# Patient Record
Sex: Female | Born: 1985 | Race: White | Hispanic: No | Marital: Married | Smoking: Never smoker
Health system: Southern US, Community
[De-identification: ages and names within clinical notes are randomized; demographics above are authoritative.]

## PROBLEM LIST (undated history)

## (undated) DIAGNOSIS — O24419 Gestational diabetes mellitus in pregnancy, unspecified control: Secondary | ICD-10-CM

## (undated) HISTORY — DX: Gestational diabetes mellitus in pregnancy, unspecified control: O24.419

## (undated) HISTORY — PX: NO PAST SURGERIES: SHX2092

---

## 2016-10-10 DIAGNOSIS — O24419 Gestational diabetes mellitus in pregnancy, unspecified control: Secondary | ICD-10-CM

## 2016-10-10 HISTORY — DX: Gestational diabetes mellitus in pregnancy, unspecified control: O24.419

## 2018-10-10 NOTE — L&D Delivery Note (Signed)
       Delivery Note   Danielle Chen is a 33 y.o. G2P1001 at [redacted]w[redacted]d Estimated Date of Delivery: 04/28/19  PRE-OPERATIVE DIAGNOSIS:  1) [redacted]w[redacted]d pregnancy.  2) SROM with irreg contractiosn  POST-OPERATIVE DIAGNOSIS:  1) [redacted]w[redacted]d pregnancy s/p Vaginal, Spontaneous    Delivery Type: Vaginal, Spontaneous    Delivery Anesthesia: None   Labor Complications:  None    ESTIMATED BLOOD LOSS: 150  ml    FINDINGS:   1) female infant, Apgar scores of    at 1 minute and    at 5 minutes and a birthweight of   ounces.    2) Nuchal cord: No  SPECIMENS:   PLACENTA:   Appearance: Intact    Removal: Spontaneous      Disposition:    DISPOSITION:  Infant to left in stable condition in the delivery room, with L&D personnel and mother,  NARRATIVE SUMMARY: Labor course:  [name] is a G2P1001 at [redacted]w[redacted]d who presented for contractions with fluid leakage.  ROM + was positive.  Pt had irreg contractions through the night, but wanted to rest.  Pitocin was begun this morning and titrated up. She began contracting regularly.  ROM of forebag performed. She evidenced good maternal expulsive effort during the second stage. She went on to deliver a viable infant. The placenta delivered without problems and was noted to be complete. A perineal and vaginal examination was performed. Episiotomy/Lacerations: 1st degree  Episiotomy or lacerations were repaired with Vicryl suture using local anesthesia. The patient tolerated this well.  Finis Bud, M.D. 04/24/2019 10:37 AM

## 2018-10-11 NOTE — Patient Instructions (Signed)
WHAT OB PATIENTS CAN EXPECT   Confirmation of pregnancy and ultrasound ordered if medically indicated-[redacted] weeks gestation  New OB (NOB) intake with nurse and New OB (NOB) labs- [redacted] weeks gestation  New OB (NOB) physical examination with provider- 11/[redacted] weeks gestation  Flu vaccine-[redacted] weeks gestation  Anatomy scan-[redacted] weeks gestation  Glucose tolerance test, blood work to test for anemia, T-dap vaccine-[redacted] weeks gestation  Vaginal swabs/cultures-STD/Group B strep-[redacted] weeks gestation  Appointments every 4 weeks until 28 weeks  Every 2 weeks from 28 weeks until 36 weeks  Weekly visits from 36 weeks until delivery  Morning Sickness  Morning sickness is when you feel sick to your stomach (nauseous) during pregnancy. You may feel sick to your stomach and throw up (vomit). You may feel sick in the morning, but you can feel this way at any time of day. Some women feel very sick to their stomach and cannot stop throwing up (hyperemesis gravidarum). Follow these instructions at home: Medicines  Take over-the-counter and prescription medicines only as told by your doctor. Do not take any medicines until you talk with your doctor about them first.  Taking multivitamins before getting pregnant can stop or lessen the harshness of morning sickness. Eating and drinking  Eat dry toast or crackers before getting out of bed.  Eat 5 or 6 small meals a day.  Eat dry and bland foods like rice and baked potatoes.  Do not eat greasy, fatty, or spicy foods.  Have someone cook for you if the smell of food causes you to feel sick or throw up.  If you feel sick to your stomach after taking prenatal vitamins, take them at night or with a snack.  Eat protein when you need a snack. Nuts, yogurt, and cheese are good choices.  Drink fluids throughout the day.  Try ginger ale made with real ginger, ginger tea made from fresh grated ginger, or ginger candies. General instructions  Do not use any products  that have nicotine or tobacco in them, such as cigarettes and e-cigarettes. If you need help quitting, ask your doctor.  Use an air purifier to keep the air in your house free of smells.  Get lots of fresh air.  Try to avoid smells that make you feel sick.  Try: ? Wearing a bracelet that is used for seasickness (acupressure wristband). ? Going to a doctor who puts thin needles into certain body points (acupuncture) to improve how you feel. Contact a doctor if:  You need medicine to feel better.  You feel dizzy or light-headed.  You are losing weight. Get help right away if:  You feel very sick to your stomach and cannot stop throwing up.  You pass out (faint).  You have very bad pain in your belly. Summary  Morning sickness is when you feel sick to your stomach (nauseous) during pregnancy.  You may feel sick in the morning, but you can feel this way at any time of day.  Making some changes to what you eat may help your symptoms go away. This information is not intended to replace advice given to you by your health care provider. Make sure you discuss any questions you have with your health care provider. Document Released: 11/03/2004 Document Revised: 10/27/2016 Document Reviewed: 10/27/2016 Elsevier Interactive Patient Education  2019 [employer]. How a Baby Grows During Pregnancy  Pregnancy begins when a female's sperm enters a female's egg (fertilization). Fertilization usually happens in one of the tubes (fallopian tubes) that connect  the ovaries to the womb (uterus). The fertilized egg moves down the fallopian tube to the uterus. Once it reaches the uterus, it implants into the lining of the uterus and begins to grow. For the first 10 weeks, the fertilized egg is called an embryo. After 10 weeks, it is called a fetus. As the fetus continues to grow, it receives oxygen and nutrients through tissue (placenta) that grows to support the developing baby. The placenta is the life  support system for the baby. It provides oxygen and nutrition and removes waste. Learning as much as you can about your pregnancy and how your baby is developing can help you enjoy the experience. It can also make you aware of when there might be a problem and when to ask questions. How long does a typical pregnancy last? A pregnancy usually lasts 280 days, or about 40 weeks. Pregnancy is divided into three periods of growth, also called trimesters:  First trimester: 0-12 weeks.  Second trimester: 13-27 weeks.  Third trimester: 28-40 weeks. The day when your baby is ready to be born (full term) is your estimated date of delivery. How does my baby develop month by month? First month  The fertilized egg attaches to the inside of the uterus.  Some cells will form the placenta. Others will form the fetus.  The arms, legs, brain, spinal cord, lungs, and heart begin to develop.  At the end of the first month, the heart begins to beat. Second month  The bones, inner ear, eyelids, hands, and feet form.  The genitals develop.  By the end of 8 weeks, all major organs are developing. Third month  All of the internal organs are forming.  Teeth develop below the gums.  Bones and muscles begin to grow. The spine can flex.  The skin is transparent.  Fingernails and toenails begin to form.  Arms and legs continue to grow longer, and hands and feet develop.  The fetus is about 3 inches (7.6 cm) long. Fourth month  The placenta is completely formed.  The external sex organs, neck, outer ear, eyebrows, eyelids, and fingernails are formed.  The fetus can hear, swallow, and move its arms and legs.  The kidneys begin to produce urine.  The skin is covered with a white, waxy coating (vernix) and very fine hair (lanugo). Fifth month  The fetus moves around more and can be felt for the first time (quickening).  The fetus starts to sleep and wake up and may begin to suck its  finger.  The nails grow to the end of the fingers.  The organ in the digestive system that makes bile (gallbladder) functions and helps to digest nutrients.  If your baby is a girl, eggs are present in her ovaries. If your baby is a boy, testicles start to move down into his scrotum. Sixth month  The lungs are formed.  The eyes open. The brain continues to develop.  Your baby has fingerprints and toe prints. Your baby's hair grows thicker.  At the end of the second trimester, the fetus is about 9 inches (22.9 cm) long. Seventh month  The fetus kicks and stretches.  The eyes are developed enough to sense changes in light.  The hands can make a grasping motion.  The fetus responds to sound. Eighth month  All organs and body systems are fully developed and functioning.  Bones harden, and taste buds develop. The fetus may hiccup.  Certain areas of the brain are still developing.  The skull remains soft. Ninth month  The fetus gains about  lb (0.23 kg) each week.  The lungs are fully developed.  Patterns of sleep develop.  The fetus's head typically moves into a head-down position (vertex) in the uterus to prepare for birth.  The fetus weighs 6-9 lb (2.72-4.08 kg) and is 19-20 inches (48.26-50.8 cm) long. What can I do to have a healthy pregnancy and help my baby develop? General instructions  Take prenatal vitamins as directed by your health care provider. These include vitamins such as folic acid, iron, calcium, and vitamin D. They are important for healthy development.  Take medicines only as directed by your health care provider. Read labels and ask a pharmacist or your health care provider whether over-the-counter medicines, supplements, and prescription drugs are safe to take during pregnancy.  Keep all follow-up visits as directed by your health care provider. This is important. Follow-up visits include prenatal care and screening tests. How do I know if my baby  is developing well? At each prenatal visit, your health care provider will do several different tests to check on your health and keep track of your baby's development. These include:  Fundal height and position. ? Your health care provider will measure your growing belly from your pubic bone to the top of the uterus using a tape measure. ? Your health care provider will also feel your belly to determine your baby's position.  Heartbeat. ? An ultrasound in the first trimester can confirm pregnancy and show a heartbeat, depending on how far along you are. ? Your health care provider will check your baby's heart rate at every prenatal visit.  Second trimester ultrasound. ? This ultrasound checks your baby's development. It also may show your baby's gender. What should I do if I have concerns about my baby's development? Always talk with your health care provider about any concerns that you may have about your pregnancy and your baby. Summary  A pregnancy usually lasts 280 days, or about 40 weeks. Pregnancy is divided into three periods of growth, also called trimesters.  Your health care provider will monitor your baby's growth and development throughout your pregnancy.  Follow your health care provider's recommendations about taking prenatal vitamins and medicines during your pregnancy.  Talk with your health care provider if you have any concerns about your pregnancy or your developing baby. This information is not intended to replace advice given to you by your health care provider. Make sure you discuss any questions you have with your health care provider. Document Released: 03/14/2008 Document Revised: 08/09/2017 Document Reviewed: 08/09/2017 Elsevier Interactive Patient Education  2019 [employer] P[employer]  The first trimester of pregnancy is from week 1 until the end of week 13 (months 1 through 3). During this time, your baby will begin to develop inside  you. At 6-8 weeks, the eyes and face are formed, and the heartbeat can be seen on ultrasound. At the end of 12 weeks, all the baby's organs are formed. Prenatal care is all the medical care you receive before the birth of your baby. Make sure you get good prenatal care and follow all of your doctor's instructions. Follow these instructions at home: Medicines  Take over-the-counter and prescription medicines only as told by your doctor. Some medicines are safe and some medicines are not safe during pregnancy.  Take a prenatal vitamin that contains at least 600 micrograms (mcg) of folic acid.  If you have trouble pooping (constipation), take  medicine that will make your stool soft (stool softener) if your doctor approves. Eating and drinking   Eat regular, healthy meals.  Your doctor will tell you the amount of weight gain that is right for you.  Avoid raw meat and uncooked cheese.  If you feel sick to your stomach (nauseous) or throw up (vomit): ? Eat 4 or 5 small meals a day instead of 3 large meals. ? Try eating a few soda crackers. ? Drink liquids between meals instead of during meals.  To prevent constipation: ? Eat foods that are high in fiber, like fresh fruits and vegetables, whole grains, and beans. ? Drink enough fluids to keep your pee (urine) clear or pale yellow. Activity  Exercise only as told by your doctor. Stop exercising if you have cramps or pain in your lower belly (abdomen) or low back.  Do not exercise if it is too hot, too humid, or if you are in a place of great height (high altitude).  Try to avoid standing for long periods of time. Move your legs often if you must stand in one place for a long time.  Avoid heavy lifting.  Wear low-heeled shoes. Sit and stand up straight.  You can have sex unless your doctor tells you not to. Relieving pain and discomfort  Wear a good support bra if your breasts are sore.  Take warm water baths (sitz baths) to soothe  pain or discomfort caused by hemorrhoids. Use hemorrhoid cream if your doctor says it is okay.  Rest with your legs raised if you have leg cramps or low back pain.  If you have puffy, bulging veins (varicose veins) in your legs: ? Wear support hose or compression stockings as told by your doctor. ? Raise (elevate) your feet for 15 minutes, 3-4 times a day. ? Limit salt in your food. Prenatal care  Schedule your prenatal visits by the twelfth week of pregnancy.  Write down your questions. Take them to your prenatal visits.  Keep all your prenatal visits as told by your doctor. This is important. Safety  Wear your seat belt at all times when driving.  Make a list of emergency phone numbers. The list should include numbers for family, friends, the hospital, and police and fire departments. General instructions  Ask your doctor for a referral to a local prenatal class. Begin classes no later than at the start of month 6 of your pregnancy.  Ask for help if you need counseling or if you need help with nutrition. Your doctor can give you advice or tell you where to go for help.  Do not use hot tubs, steam rooms, or saunas.  Do not douche or use tampons or scented sanitary pads.  Do not cross your legs for long periods of time.  Avoid all herbs and alcohol. Avoid drugs that are not approved by your doctor.  Do not use any tobacco products, including cigarettes, chewing tobacco, and electronic cigarettes. If you need help quitting, ask your doctor. You may get counseling or other support to help you quit.  Avoid cat litter boxes and soil used by cats. These carry germs that can cause birth defects in the baby and can cause a loss of your baby (miscarriage) or stillbirth.  Visit your dentist. At home, brush your teeth with a soft toothbrush. Be gentle when you floss. Contact a doctor if:  You are dizzy.  You have mild cramps or pressure in your lower belly.  You have a  nagging pain  in your belly area.  You continue to feel sick to your stomach, you throw up, or you have watery poop (diarrhea).  You have a bad smelling fluid coming from your vagina.  You have pain when you pee (urinate).  You have increased puffiness (swelling) in your face, hands, legs, or ankles. Get help right away if:  You have a fever.  You are leaking fluid from your vagina.  You have spotting or bleeding from your vagina.  You have very bad belly cramping or pain.  You gain or lose weight rapidly.  You throw up blood. It may look like coffee grounds.  You are around people who have German measles, fifth disease, or chickenpox.  You have a very bad headache.  You have shortness of breath.  You have any kind of trauma, such as from a fall or a car accident. Summary  The first trimester of pregnancy is from week 1 until the end of week 13 (months 1 through 3).  To take care of yourself and your unborn baby, you will need to eat healthy meals, take medicines only if your doctor tells you to do so, and do activities that are safe for you and your baby.  Keep all follow-up visits as told by your doctor. This is important as your doctor will have to ensure that your baby is healthy and growing well. This information is not intended to replace advice given to you by your health care provider. Make sure you discuss any questions you have with your health care provider. Document Released: 03/14/2008 Document Revised: 10/04/2016 Document Reviewed: 10/04/2016 Elsevier Interactive Patient Education  2019 [employer] Commonly Asked Questions During Pregnancy  Cats: A parasite can be excreted in cat feces.  To avoid exposure you need to have another person empty the little box.  If you must empty the litter box you will need to wear gloves.  Wash your hands after handling your cat.  This parasite can also be found in raw or undercooked meat so this should also be avoided.  Colds, Sore  Throats, Flu: Please check your medication sheet to see what you can take for symptoms.  If your symptoms are unrelieved by these medications please call the office.  Dental Work: Most any dental work your dentist recommends is permitted.  X-rays should only be taken during the first trimester if absolutely necessary.  Your abdomen should be shielded with a lead apron during all x-rays.  Please notify your provider prior to receiving any x-rays.  Novocaine is fine; gas is not recommended.  If your dentist requires a note from us prior to dental work please call the office and we will provide one for you.  Exercise: Exercise is an important part of staying healthy during your pregnancy.  You may continue most exercises you were accustomed to prior to pregnancy.  Later in your pregnancy you will most likely notice you have difficulty with activities requiring balance like riding a bicycle.  It is important that you listen to your body and avoid activities that put you at a higher risk of falling.  Adequate rest and staying well hydrated are a must!  If you have questions about the safety of specific activities ask your provider.    Exposure to Children with illness: Try to avoid obvious exposure; report any symptoms to us when noted,  If you have chicken pos, red measles or mumps, you should be immune to these diseases.     Please do not take any vaccines while pregnant unless you have checked with your OB provider.  Fetal Movement: After 28 weeks we recommend you do "kick counts" twice daily.  Lie or sit down in a calm quiet environment and count your baby movements "kicks".  You should feel your baby at least 10 times per hour.  If you have not felt 10 kicks within the first hour get up, walk around and have something sweet to eat or drink then repeat for an additional hour.  If count remains less than 10 per hour notify your provider.  Fumigating: Follow your pest control agent's advice as to how long to  stay out of your home.  Ventilate the area well before re-entering.  Hemorrhoids:   Most over-the-counter preparations can be used during pregnancy.  Check your medication to see what is safe to use.  It is important to use a stool softener or fiber in your diet and to drink lots of liquids.  If hemorrhoids seem to be getting worse please call the office.   Hot Tubs:  Hot tubs Jacuzzis and saunas are not recommended while pregnant.  These increase your internal body temperature and should be avoided.  Intercourse:  Sexual intercourse is safe during pregnancy as long as you are comfortable, unless otherwise advised by your provider.  Spotting may occur after intercourse; report any bright red bleeding that is heavier than spotting.  Labor:  If you know that you are in labor, please go to the hospital.  If you are unsure, please call the office and let us help you decide what to do.  Lifting, straining, etc:  If your job requires heavy lifting or straining please check with your provider for any limitations.  Generally, you should not lift items heavier than that you can lift simply with your hands and arms (no back muscles)  Painting:  Paint fumes do not harm your pregnancy, but may make you ill and should be avoided if possible.  Latex or water based paints have less odor than oils.  Use adequate ventilation while painting.  Permanents & Hair Color:  Chemicals in hair dyes are not recommended as they cause increase hair dryness which can increase hair loss during pregnancy.  " Highlighting" and permanents are allowed.  Dye may be absorbed differently and permanents may not hold as well during pregnancy.  Sunbathing:  Use a sunscreen, as skin burns easily during pregnancy.  Drink plenty of fluids; avoid over heating.  Tanning Beds:  Because their possible side effects are still unknown, tanning beds are not recommended.  Ultrasound Scans:  Routine ultrasounds are performed at approximately 20  weeks.  You will be able to see your baby's general anatomy an if you would like to know the gender this can usually be determined as well.  If it is questionable when you conceived you may also receive an ultrasound early in your pregnancy for dating purposes.  Otherwise ultrasound exams are not routinely performed unless there is a medical necessity.  Although you can request a scan we ask that you pay for it when conducted because insurance does not cover " patient request" scans.  Work: If your pregnancy proceeds without complications you may work until your due date, unless your physician or employer advises otherwise.  Round Ligament Pain/Pelvic Discomfort:  Sharp, shooting pains not associated with bleeding are fairly common, usually occurring in the second trimester of pregnancy.  They tend to be worse when standing up or  when you remain standing for long periods of time.  These are the result of pressure of certain pelvic ligaments called "round ligaments".  Rest, Tylenol and heat seem to be the most effective relief.  As the womb and fetus grow, they rise out of the pelvis and the discomfort improves.  Please notify the office if your pain seems different than that described.  It may represent a more serious condition.  Common Medications Safe in Pregnancy  Acne:      Constipation:  Benzoyl Peroxide     Colace  Clindamycin      Dulcolax Suppository  Topica Erythromycin     Fibercon  Salicylic Acid      Metamucil         Miralax AVOID:        Senakot   Accutane    Cough:  Retin-A       Cough Drops  Tetracycline      Phenergan w/ Codeine if Rx  Minocycline      Robitussin (Plain & DM)  Antibiotics:     Crabs/Lice:  Ceclor       RID  Cephalosporins    AVOID:  E-Mycins      Kwell  Keflex  Macrobid/Macrodantin   Diarrhea:  Penicillin      Kao-Pectate  Zithromax      Imodium AD         PUSH FLUIDS AVOID:       Cipro     Fever:  Tetracycline      Tylenol (Regular or  Extra  Minocycline       Strength)  Levaquin      Extra Strength-Do not          Exceed 8 tabs/24 hrs Caffeine:        <224m/day (equiv. To 1 cup of coffee or  approx. 3 12 oz sodas)         Gas: Cold/Hayfever:       Gas-X  Benadryl      Mylicon  Claritin       Phazyme  **Claritin-D        Chlor-Trimeton    Headaches:  Dimetapp      ASA-Free Excedrin  Drixoral-Non-Drowsy     Cold Compress  Mucinex (Guaifenasin)     Tylenol (Regular or Extra  Sudafed/Sudafed-12 Hour     Strength)  **Sudafed PE Pseudoephedrine   Tylenol Cold & Sinus     Vicks Vapor Rub  Zyrtec  **AVOID if Problems With Blood Pressure         Heartburn: Avoid lying down for at least 1 hour after meals  Aciphex      Maalox     Rash:  Milk of Magnesia     Benadryl    Mylanta       1% Hydrocortisone Cream  Pepcid  Pepcid Complete   Sleep Aids:  Prevacid      Ambien   Prilosec       Benadryl  Rolaids       Chamomile Tea  Tums (Limit 4/day)     Unisom  Zantac       Tylenol PM         Warm milk-add vanilla or  Hemorrhoids:       Sugar for taste  Anusol/Anusol H.C.  (RX: Analapram 2.5%)  Sugar Substitutes:  Hydrocortisone OTC     Ok in moderation  Preparation H      Tucks  Vaseline lotion applied to tissue with wiping    Herpes:     Throat:  Acyclovir      Oragel  Famvir  Valtrex     Vaccines:         Flu Shot Leg Cramps:       *Gardasil  Benadryl      Hepatitis A         Hepatitis B Nasal Spray:       Pneumovax  Saline Nasal Spray     Polio Booster         Tetanus Nausea:       Tuberculosis test or PPD  Vitamin B6 25 mg TID   AVOID:    Dramamine      *Gardasil  Emetrol       Live Poliovirus  Ginger Root 250 mg QID    MMR (measles, mumps &  High Complex Carbs @ Bedtime    rebella)  Sea Bands-Accupressure    Varicella (Chickenpox)  Unisom 1/2 tab TID     *No known complications           If received before Pain:         Known pregnancy;   Darvocet       Resume series  after  Lortab        Delivery  Percocet    Yeast:   Tramadol      Femstat  Tylenol 3      Gyne-lotrimin  Ultram       Monistat  Vicodin           MISC:         All Sunscreens           Hair Coloring/highlights          Insect Repellant's          (Including DEET)         Mystic Tans

## 2018-10-11 NOTE — Progress Notes (Signed)
      Danielle NeedyMichelle Grabel presents for NOB nurse intake visit. [facility] [place] [place]/[facility], with Pierce CranePortia Cohens, MD.  Luna GlasgowG2.  P1001.  LMP 07/12/18.  EDD 04/28/19.  Ga [id number]. Pregnancy education material explained and given.  1 cats in the home.  NOB labs ordered. BMI less than 30. TSH/HbgA1c not ordered.  HIV and drug screen explained and ordered. Genetic screening discussed. Genetic testing; Unsure. Pt to discuss genetic testing with provider. PNV encouraged. Pt to follow up with provider in 1 weeks for NOB physical. [facility] Financial Policy and FMLA forms were signed and went over with pt. Pt stated that she understood.   BP 127/80   Pulse 88   Ht 5' 3.5" (1.613 m)   Wt 130 lb 11.2 oz (59.3 kg)   LMP 07/12/2018   BMI 22.79 kg/m

## 2018-10-12 ENCOUNTER — Ambulatory Visit: Payer: Self-pay | Admitting: Obstetrics and Gynecology

## 2018-10-12 VITALS — BP 127/80 | HR 88 | Ht 63.5 in | Wt 130.7 lb

## 2018-10-12 DIAGNOSIS — Z113 Encounter for screening for infections with a predominantly sexual mode of transmission: Secondary | ICD-10-CM

## 2018-10-12 DIAGNOSIS — Z0283 Encounter for blood-alcohol and blood-drug test: Secondary | ICD-10-CM

## 2018-10-12 DIAGNOSIS — Z3481 Encounter for supervision of other normal pregnancy, first trimester: Secondary | ICD-10-CM

## 2018-10-13 LAB — HEPATITIS B SURFACE ANTIGEN: Hepatitis B Surface Ag: NEGATIVE

## 2018-10-13 LAB — URINALYSIS, ROUTINE W REFLEX MICROSCOPIC
Bilirubin, UA: NEGATIVE
Glucose, UA: NEGATIVE
Ketones, UA: NEGATIVE
Leukocytes, UA: NEGATIVE
Nitrite, UA: NEGATIVE
Protein, UA: NEGATIVE
RBC UA: NEGATIVE
Specific Gravity, UA: 1.011 (ref 1.005–1.030)
Urobilinogen, Ur: 0.2 mg/dL (ref 0.2–1.0)
pH, UA: 7.5 (ref 5.0–7.5)

## 2018-10-13 LAB — ABO AND RH: Rh Factor: POSITIVE

## 2018-10-13 LAB — DRUG PROFILE, UR, 9 DRUGS (LABCORP)
Amphetamines, Urine: NEGATIVE ng/mL
Barbiturate Quant, Ur: NEGATIVE ng/mL
Benzodiazepine Quant, Ur: NEGATIVE ng/mL
Cannabinoid Quant, Ur: NEGATIVE ng/mL
Cocaine (Metab.): NEGATIVE ng/mL
Methadone Screen, Urine: NEGATIVE ng/mL
Opiate Quant, Ur: NEGATIVE ng/mL
PCP Quant, Ur: NEGATIVE ng/mL
Propoxyphene: NEGATIVE ng/mL

## 2018-10-13 LAB — HIV ANTIBODY (ROUTINE TESTING W REFLEX): HIV Screen 4th Generation wRfx: NONREACTIVE

## 2018-10-13 LAB — GC/CHLAMYDIA PROBE AMP
Chlamydia trachomatis, NAA: NEGATIVE
Neisseria gonorrhoeae by PCR: NEGATIVE

## 2018-10-13 LAB — VARICELLA ZOSTER ANTIBODY, IGG: Varicella zoster IgG: 519 index (ref >165)

## 2018-10-13 LAB — ANTIBODY SCREEN: ANTIBODY SCREEN: NEGATIVE

## 2018-10-13 LAB — NICOTINE SCREEN, URINE: Cotinine Ql Scrn, Ur: NEGATIVE ng/mL

## 2018-10-13 LAB — RPR: RPR Ser Ql: NONREACTIVE

## 2018-10-13 LAB — RUBELLA SCREEN: Rubella Antibodies, IGG: 4.29 index (ref >0.99)

## 2018-10-16 LAB — URINE CULTURE, OB REFLEX

## 2018-10-16 LAB — CULTURE, OB URINE

## 2018-10-18 ENCOUNTER — Other Ambulatory Visit: Payer: Self-pay

## 2018-10-18 ENCOUNTER — Encounter: Payer: Self-pay | Admitting: Obstetrics and Gynecology

## 2018-10-18 ENCOUNTER — Ambulatory Visit: Payer: Self-pay | Admitting: Obstetrics and Gynecology

## 2018-10-18 ENCOUNTER — Other Ambulatory Visit (HOSPITAL_COMMUNITY)
Admission: RE | Admit: 2018-10-18 | Discharge: 2018-10-18 | Disposition: A | Discharge status: Home / Self Care | Payer: Commercial Managed Care - PPO | Source: Ambulatory Visit | Attending: Obstetrics and Gynecology | Admitting: Obstetrics and Gynecology

## 2018-10-18 VITALS — BP 120/82 | HR 72 | Wt 127.6 lb

## 2018-10-18 DIAGNOSIS — Z3481 Encounter for supervision of other normal pregnancy, first trimester: Secondary | ICD-10-CM

## 2018-10-18 LAB — POCT URINALYSIS DIPSTICK OB
Bilirubin, UA: NEGATIVE
Blood, UA: NEGATIVE
Glucose, UA: NEGATIVE
Ketones, UA: NEGATIVE
Leukocytes, UA: NEGATIVE
NITRITE UA: NEGATIVE
PROTEIN: NEGATIVE
Spec Grav, UA: 1.01 (ref 1.010–1.025)
Urobilinogen, UA: 0.2 E.U./dL
pH, UA: 6.5 (ref 5.0–8.0)

## 2018-10-18 NOTE — Progress Notes (Signed)
Patient comes in today for new OB visit. Patient has had [place] in [place] at 5 weeks and 5 days. Needs Pap today. Denies nausea and vomiting.

## 2018-10-18 NOTE — Progress Notes (Signed)
NOB: Patient without complaint.  Desires cell free DNA testing and AFP testing later in pregnancy.  History of gestational diabetes diet-controlled.  Scheduled for early 1 hour GCT next visit. First pregnancy uncomplicated despite diet-controlled gestational diabetes with a spontaneous labor vaginal term birth.  Physical examination General NAD, Conversant  HEENT Atraumatic; Op clear with mmm.  Normo-cephalic. Pupils reactive. Anicteric sclerae  Thyroid/Neck Smooth without nodularity or enlargement. Normal ROM.  Neck Supple.  Skin No rashes, lesions or ulceration. Normal palpated skin turgor. No nodularity.  Breasts: No masses or discharge.  Symmetric.  No axillary adenopathy.  Lungs: Clear to auscultation.No rales or wheezes. Normal Respiratory effort, no retractions.  Heart: NSR.  No murmurs or rubs appreciated. No periferal edema  Abdomen: Soft.  Non-tender.  No masses.  No HSM. No hernia  Extremities: Moves all appropriately.  Normal ROM for age. No lymphadenopathy.  Neuro: Oriented to PPT.  Normal mood. Normal affect.     Pelvic:   Vulva: Normal appearance.  No lesions.  Vagina: No lesions or abnormalities noted.  Support: Normal pelvic support.  Urethra No masses tenderness or scarring.  Meatus Normal size without lesions or prolapse.  Cervix: Normal appearance.  No lesions.  Anus: Normal exam.  No lesions.  Perineum: Normal exam.  No lesions.        Bimanual   Adnexae: No masses.  Non-tender to palpation.  Uterus: Enlarged. 13 weeks Pos FHT's  Non-tender.  Mobile.  AV.  Adnexae: No masses.  Non-tender to palpation.  Cul-de-sac: Negative for abnormality.  Adnexae: No masses.  Non-tender to palpation.         Pelvimetry   Diagonal: Reached.  Spines: Average.  Sacrum: Concave.  Pubic Arch: Normal.   Pap performed

## 2018-10-22 LAB — CYTOLOGY - PAP
Diagnosis: NEGATIVE
HPV (WINDOPATH): NOT DETECTED

## 2018-10-24 LAB — MATERNIT21  PLUS CORE+ESS+SCA, BLOOD
CHROMOSOME 21: NEGATIVE
Chromosome 13: NEGATIVE
Chromosome 18: NEGATIVE
Fetal Fraction: 10
Y CHROMOSOME: NOT DETECTED

## 2018-11-15 ENCOUNTER — Other Ambulatory Visit: Payer: Commercial Managed Care - PPO

## 2018-11-15 ENCOUNTER — Ambulatory Visit (INDEPENDENT_AMBULATORY_CARE_PROVIDER_SITE_OTHER): Payer: Commercial Managed Care - PPO | Admitting: Obstetrics and Gynecology

## 2018-11-15 ENCOUNTER — Telehealth: Payer: Self-pay

## 2018-11-15 VITALS — BP 116/57 | HR 67 | Wt 133.6 lb

## 2018-11-15 DIAGNOSIS — Z1379 Encounter for other screening for genetic and chromosomal anomalies: Secondary | ICD-10-CM

## 2018-11-15 DIAGNOSIS — Z3482 Encounter for supervision of other normal pregnancy, second trimester: Secondary | ICD-10-CM

## 2018-11-15 DIAGNOSIS — Z8632 Personal history of gestational diabetes: Secondary | ICD-10-CM

## 2018-11-15 DIAGNOSIS — Z23 Encounter for immunization: Secondary | ICD-10-CM

## 2018-11-15 DIAGNOSIS — O09292 Supervision of pregnancy with other poor reproductive or obstetric history, second trimester: Secondary | ICD-10-CM

## 2018-11-15 LAB — POCT URINALYSIS DIPSTICK OB
Bilirubin, UA: NEGATIVE
Blood, UA: NEGATIVE
Glucose, UA: NEGATIVE
Ketones, UA: NEGATIVE
Leukocytes, UA: NEGATIVE
NITRITE UA: NEGATIVE
POC,PROTEIN,UA: NEGATIVE
Spec Grav, UA: <=1.005 — AB (ref 1.010–1.025)
Urobilinogen, UA: 0.2 E.U./dL
pH, UA: 7 (ref 5.0–8.0)

## 2018-11-15 NOTE — Progress Notes (Signed)
ROB: Doing well, no major complaints. Normal genetic screen, for AFP today. Received flu vaccine today.  To be scheduled for early glucola due to prior pregnancy with GDM (diet). RTC in 4 weeks, for anatomy scan then.

## 2018-11-15 NOTE — Patient Instructions (Signed)

## 2018-11-15 NOTE — Progress Notes (Signed)
ROB-Pt stated that she was doing well. Pt stated that she wanted to speak more to the doctor and her husband about doing the AFP. No problems or concerns.

## 2018-11-20 LAB — AFP, SERUM, OPEN SPINA BIFIDA
AFP MoM: 1.32
AFP Value: 51.4 ng/mL
Gest. Age on Collection Date: 16.6 weeks
Maternal Age At EDD: 32.2 yr
OSBR Risk 1 IN: 4529
TEST RESULTS AFP: NEGATIVE
Weight: 133 [lb_av]

## 2018-11-23 ENCOUNTER — Other Ambulatory Visit: Payer: Commercial Managed Care - PPO

## 2018-11-23 DIAGNOSIS — O09292 Supervision of pregnancy with other poor reproductive or obstetric history, second trimester: Secondary | ICD-10-CM

## 2018-11-23 DIAGNOSIS — Z8632 Personal history of gestational diabetes: Principal | ICD-10-CM

## 2018-11-24 LAB — GLUCOSE, 1 HOUR GESTATIONAL: Gestational Diabetes Screen: 77 mg/dL (ref 65–139)

## 2018-12-11 ENCOUNTER — Ambulatory Visit: Payer: Commercial Managed Care - PPO

## 2018-12-11 ENCOUNTER — Encounter: Payer: Self-pay | Admitting: Obstetrics and Gynecology

## 2018-12-11 ENCOUNTER — Ambulatory Visit (INDEPENDENT_AMBULATORY_CARE_PROVIDER_SITE_OTHER): Payer: Commercial Managed Care - PPO | Admitting: Obstetrics and Gynecology

## 2018-12-11 VITALS — BP 114/77 | HR 78 | Ht 64.0 in | Wt 138.2 lb

## 2018-12-11 DIAGNOSIS — Z3482 Encounter for supervision of other normal pregnancy, second trimester: Secondary | ICD-10-CM

## 2018-12-11 LAB — POCT URINALYSIS DIPSTICK OB
Bilirubin, UA: NEGATIVE
Blood, UA: NEGATIVE
Glucose, UA: NEGATIVE
Ketones, UA: NEGATIVE
Leukocytes, UA: NEGATIVE
Nitrite, UA: NEGATIVE
POC,PROTEIN,UA: NEGATIVE
Spec Grav, UA: 1.01 (ref 1.010–1.025)
Urobilinogen, UA: 0.2 E.U./dL
pH, UA: 6 (ref 5.0–8.0)

## 2018-12-11 NOTE — Progress Notes (Signed)
ROB: No complaints.  Feels daily movement.  Ultrasound next week FAS

## 2018-12-11 NOTE — Progress Notes (Signed)
Patient comes in today for ROB visit. No concerns today. 

## 2018-12-17 ENCOUNTER — Ambulatory Visit
Admission: RE | Admit: 2018-12-17 | Discharge: 2018-12-17 | Disposition: A | Discharge status: Home / Self Care | Payer: Commercial Managed Care - PPO | Source: Ambulatory Visit | Attending: Obstetrics and Gynecology | Admitting: Obstetrics and Gynecology

## 2018-12-17 DIAGNOSIS — Z3482 Encounter for supervision of other normal pregnancy, second trimester: Secondary | ICD-10-CM | POA: Diagnosis not present

## 2019-01-04 ENCOUNTER — Telehealth: Payer: Self-pay

## 2019-01-04 NOTE — Telephone Encounter (Signed)
Pt called no answer LM that I was calling to go over some questions for a prescreening for COVID-19 before her visit on Monday. Pt was advised to please call the office to do her prescreening.

## 2019-01-04 NOTE — Telephone Encounter (Signed)
Pt called and went over COVID-19 questions. Pt stated that she was not having any symptoms related to the virus. Pt was informed that if she developed any of those symptoms over the weekend to please call the office before arriving to her appointment. Pt stated that she understood. Pt was also advised that there are no visitors in the office at this time due the COVID-19. Pt stated that she understood.

## 2019-01-04 NOTE — Patient Instructions (Signed)
FREQUENTLY ASKED QUESTIONS FOR OBSTETRICS/PEDIATRICS    Q: Why are visitor restrictions different for maternity care areas?  [place] is restricting visitors for the duration of the patient's hospitalization. The birth of a child involves the mother, considered the patient, and a birthing partner. These are unprecedented times and we are making the exception to allow a birthing partner to be a part of the patient unit. No other guests will be allowed in our [place] [facility] and at [facility].   Q: Are credentialed doulas allowed to support their existing patients?  We acknowledge the value these doula partnerships offer our care teams and many birthing families in our communities. Each laboring mother is allowed one birthing partner of the patient's choosing for her entire hospitalization.   Q: Are visitor restrictions different for hospitalized children?  Pediatric patients (infants and children under 42 years of age), such as those in the Children's Unit, Pediatric ICU and NICU, will be allowed two visitors (parents or legal guardians)   Q: Are pregnant women at an increased risk for COVID-19?  The [employer] of Obstetricians and Gynecologists (ACOG) is monitoring closely the coronavirus pandemic. With the limited information available, data does not indicate pregnant women are at an increased risk. However, pregnant women are known to be at greater risk for respiratory infections like flu. With that in mind, expectant mothers are considered an at-risk population for COVID-19, according to ACOG.   Q: Are newborns at an increased risk for COVID-19?  A limited sample of COVID-19 data with newborns indicates the virus is not transferred to the infant during pregnancy. However, postpartum separation is recommended by the Centers for Disease Control (CDC). As a result [place] recommends and strongly encourages  temporary separation of moms and babies who test positive for COVID-19 or are awaiting results to rule out COVID-19 based on CDC guidelines.   Q: If you have a suspected case of COVID-19, is the NICU couplet care room an option?  No. If either patient is considered at-risk for having COVID-19, the [place] [facility] will not use the NICU couplet care rooms for that family.   Q: [place] is urging that elective procedures be postponed. What is considered elective for women's and children's service line?  NOT ELECTIVE: Obstetric procedures, even those with an element of choice on timing, are not considered elective. Circumcisions are considered elective procedures, however, these do not deplete blood products and other resources, which is the spirit in which the COVID-19 postponement of elective procedures was intended. Therefore, circumcisions will be allowed.   ELECTIVE: Postpartum tubal ligations are considered elective and should be postponed. Q&A for Obstetricians, Gynecologists and Pediatricians  Published December 28, 2018   [facility] supports as much as possible the medical care team working with the patient's individual needs to address timing during these unprecedented times. We seek the support of our medical care team in preserving needed resources throughout our crisis response to COVID-19.   Q: How does COVID-19 impact breastfeeding?  Breastmilk is safe for your baby - even if the mother has tested positive for COVID-19. If a COVID-19+ mother decides to breastfeed while inpatient and after discharge, we suggest proper protective equipment be worn and hand hygiene be performed  before and after feeding the infant. The new mother also has the option to pump her milk and have a healthy family member feed the baby to protect the baby from getting the virus.   Q: Should we urge patients to avoid baby showers and large gatherings?  Yes. As has been recommended  for all citizens in our communities, gatherings of 10 or more should be avoided - pregnant or not. Seek creative options for "hosting" baby showers through electronic means that honor the request for social distancing during this time of heightened awareness.   Q: Should patients miss their prenatal appointments?  No. Prenatal visits are NOT elective. While we want to limit contact and exposure, prenatal care is vital right now. Contact your physician's office if you have concerns about your visits. We are limiting outpatient office visits to the patient and one guest in order to reduce the potential for exposure.   Q: What if a pregnant woman feels sick? Should she miss her prenatal visit then?  A pregnant woman experiencing coronavirus-like symptoms (i.e., cough, fever, difficulty breathing, shortness of breath, gastrointestinal issues) should contact her pregnancy care provider by phone. Her medical professional can best determine whether she should use a video visit or possibly go to a collection site to be tested for COVID-19. Contacting her primary care provider or her pregnancy care provider is her first step.   Q: What can I do about childbirth education? All the classes are cancelled.  The [facility] Centers will offer online learning to support mothers on their journey. We currently offer Understanding Childbirth, Understanding Breastfeeding and Understanding Newborn Care as an online class. Please visit our website, [email or web address], to register for an online class.   Q: How can I keep from getting COVID-19? Q&A for Obstetricians, Gynecologists and Pediatricians  Published December 28, 2018   Together, we can reduce the risk of exposure to the virus and help you and your family remain healthy and safe. One of the best ways to protect yourself is to wash your hands frequently using soap and water. Also, you should avoid touching your eyes, nose and mouth with unwashed  hands, avoid physical contact with others and practice social distancing.   Q: How are employees being informed about what to do?  [place] leaders receive a daily COVID-19 update and share relevant information with their teams. This is a time when health care professionals are called on to lead within our community. We appreciate our staff's engagement with our COVID-19 updates and encourage them to share best practices on reducing the spread of the virus with our patients and community. We are prepared to provide the exceptional COVID-19 care and coordination our community needs, expects and deserves.   Q: Who's in charge of this issue at [facility]?  The leadership structure and process established to address COVID-19 includes Chief Physician Executive Birdie Sons, MD; Infection Prevention Medical Director Judyann Munson, MD; and Infection Prevention Interim Director Jason Fila, MSN, RN, CIC, CSPDT. A team of [place] experts reflecting a broad spectrum of our workforce is meeting daily to evaluate new information we receive about COVID-19 and to adapt policies and practices accordingly.                         Published December 28, 2018        Coronavirus (COVID-19) Are you at risk?  Are you at risk for the Coronavirus (COVID-19)?  To be  considered HIGH RISK for Coronavirus (COVID-19), you have to meet the following criteria:  . Traveled to [place], [place], [place], [place] or [place]; or in the [place] to [place], [place], [place], or [place]; and have fever, cough, and shortness of breath within the last 2 weeks of travel OR . Been in close contact with a person diagnosed with COVID-19 within the last 2 weeks and have fever, cough, and shortness of breath . IF YOU DO NOT MEET THESE CRITERIA, YOU ARE CONSIDERED LOW RISK FOR COVID-19.  What to do if you are HIGH RISK for COVID-19?  Marland Kitchen If you are having a medical emergency, call 911. . Seek medical care right away.  Before you go to a doctor's office, urgent care or emergency department, call ahead and tell them about your recent travel, contact with someone diagnosed with COVID-19, and your symptoms. You should receive instructions from your physician's office regarding next steps of care.  . When you arrive at healthcare provider, tell the healthcare staff immediately you have returned from visiting [place], [place], [place], [place] or [place]; or traveled in the [place] to [place], [place], [place], or [place]; in the last two weeks or you have been in close contact with a person diagnosed with COVID-19 in the last 2 weeks.   . Tell the health care staff about your symptoms: fever, cough and shortness of breath. . After you have been seen by a medical provider, you will be either: o Tested for (COVID-19) and discharged home on quarantine except to seek medical care if symptoms worsen, and asked to  - Stay home and avoid contact with others until you get your results (4-5 days)  - Avoid travel on public transportation if possible (such as bus, train, or airplane) or o Sent to the Emergency Department by EMS for evaluation, COVID-19 testing, and possible admission depending on your condition and test results.  What to do if you are LOW RISK for COVID-19?  Reduce your risk of any infection by using the same precautions used for avoiding the common cold or flu:  Marland Kitchen Wash your hands often with soap and warm water for at least 20 seconds.  If soap and water are not readily available, use an alcohol-based hand sanitizer with at least 60% alcohol.  . If coughing or sneezing, cover your mouth and nose by coughing or sneezing into the elbow areas of your shirt or coat, into a tissue or into your sleeve (not your hands). . Avoid shaking hands with others and consider head nods or verbal greetings only. . Avoid touching your eyes, nose, or mouth with unwashed hands.  . Avoid close contact with people who are  sick. . Avoid places or events with large numbers of people in one location, like concerts or sporting events. . Carefully consider travel plans you have or are making. . If you are planning any travel outside or inside the [place], visit the CDC's Travelers' Health webpage for the latest health notices. . If you have some symptoms but not all symptoms, continue to monitor at home and seek medical attention if your symptoms worsen. . If you are having a medical emergency, call 911.   ADDITIONAL HEALTHCARE OPTIONS FOR PATIENTS  [place] [facility] / e-Visit: [email or web address]         [facility]: [phone]  [facility]: [phone]  [facility]: [phone]   Obstetric patients have recently been named to the list of individuals who are considered a "high risk" group.  This is likely due to the decreased immune function that pregnant women have and thus are more susceptible to infection.The [employer] of Obstetricians and Gynecologists (ACOG) is monitoring closely the coronavirus pandemic. With the limited information available, data does not indicate pregnant women are at an increased risk. However, pregnant women are known to be at greater risk for respiratory infections like flu. With that in mind, expectant mothers are considered an at-risk population for COVID-19, according to ACOG.  The typical precautions that are being advised apply to you.   Social distancing-stay home as much as possible and limit contact with others. Especially avoid groups of people.   Wash your hands often with soap and water.  If soap and water are not readily available using alcohol based hand sanitizer. If coughing and sneezing, cover your mouth and nose when coughing or sneezing. Avoid shaking hands with others.  Avoid touching your eyes nose or mouth with unwashed hands. If you develop upper  respiratory type symptoms especially with fever seek medical attention.  If you have some symptoms, but not all continue to monitor at home and if symptoms worsen then seek medical attention.  Please remember, COVID-19 is not thought to be transmissible across the placenta-so that while your baby is in utero he/she will not get infected even if you have the virus.

## 2019-01-07 ENCOUNTER — Ambulatory Visit (INDEPENDENT_AMBULATORY_CARE_PROVIDER_SITE_OTHER): Payer: Commercial Managed Care - PPO | Admitting: Obstetrics and Gynecology

## 2019-01-07 ENCOUNTER — Encounter: Payer: Self-pay | Admitting: Obstetrics and Gynecology

## 2019-01-07 ENCOUNTER — Other Ambulatory Visit: Payer: Self-pay

## 2019-01-07 VITALS — BP 108/68 | HR 66 | Wt 142.7 lb

## 2019-01-07 DIAGNOSIS — Z8632 Personal history of gestational diabetes: Secondary | ICD-10-CM

## 2019-01-07 DIAGNOSIS — Z131 Encounter for screening for diabetes mellitus: Secondary | ICD-10-CM

## 2019-01-07 DIAGNOSIS — O09292 Supervision of pregnancy with other poor reproductive or obstetric history, second trimester: Secondary | ICD-10-CM

## 2019-01-07 DIAGNOSIS — Z3482 Encounter for supervision of other normal pregnancy, second trimester: Secondary | ICD-10-CM

## 2019-01-07 DIAGNOSIS — Z130 Encounter for screening for diseases of the blood and blood-forming organs and certain disorders involving the immune mechanism: Secondary | ICD-10-CM

## 2019-01-07 DIAGNOSIS — O09299 Supervision of pregnancy with other poor reproductive or obstetric history, unspecified trimester: Secondary | ICD-10-CM

## 2019-01-07 LAB — POCT URINALYSIS DIPSTICK OB
Bilirubin, UA: NEGATIVE
Blood, UA: NEGATIVE
Glucose, UA: NEGATIVE
Ketones, UA: NEGATIVE
Leukocytes, UA: NEGATIVE
Nitrite, UA: NEGATIVE
PROTEIN: NEGATIVE
Spec Grav, UA: <=1.005 — AB (ref 1.010–1.025)
UROBILINOGEN UA: 0.2 U/dL
pH, UA: 7.5 (ref 5.0–8.0)

## 2019-01-07 NOTE — Progress Notes (Signed)
ROB: Patient doing well, no major complaints. Does note some pelvic discomfort.  Advised on pregnancy garment. RTC in 6 weeks, for 28 week labs.

## 2019-01-07 NOTE — Progress Notes (Signed)
ROB-Pt present today for prenatal care. Pt stated that she was doing well. Pt stated Fetal movement present, light pressure and pain in the pelvic area. No contractions or vaginal bleeding.

## 2019-01-08 ENCOUNTER — Encounter: Payer: Commercial Managed Care - PPO | Admitting: Obstetrics and Gynecology

## 2019-02-18 ENCOUNTER — Telehealth: Payer: Self-pay

## 2019-02-18 NOTE — Telephone Encounter (Signed)
Pt prescreened no symptoms of COVID-19 pt is aware to wear a face mask and the no visitors policy.    Coronavirus (COVID-19) Are you at risk?  Are you at risk for the Coronavirus (COVID-19)?  To be considered HIGH RISK for Coronavirus (COVID-19), you have to meet the following criteria:  . Traveled to [place], [place], [place], [place] or [place]; or in the [place] to [place], [place], [place], or [place]; and have fever, cough, and shortness of breath within the last 2 weeks of travel OR . Been in close contact with a person diagnosed with COVID-19 within the last 2 weeks and have fever, cough, and shortness of breath . IF YOU DO NOT MEET THESE CRITERIA, YOU ARE CONSIDERED LOW RISK FOR COVID-19.  What to do if you are HIGH RISK for COVID-19?  Marland Kitchen If you are having a medical emergency, call 911. . Seek medical care right away. Before you go to a doctor's office, urgent care or emergency department, call ahead and tell them about your recent travel, contact with someone diagnosed with COVID-19, and your symptoms. You should receive instructions from your physician's office regarding next steps of care.  . When you arrive at healthcare provider, tell the healthcare staff immediately you have returned from visiting [place], [place], [place], [place] or [place]; or traveled in the [place] to [place], [place], [place], or [place]; in the last two weeks or you have been in close contact with a person diagnosed with COVID-19 in the last 2 weeks.   . Tell the health care staff about your symptoms: fever, cough and shortness of breath. . After you have been seen by a medical provider, you will be either: o Tested for (COVID-19) and discharged home on quarantine except to seek medical care if symptoms worsen, and asked to  - Stay home and avoid contact with others until you get your results (4-5 days)  - Avoid travel on public transportation if possible (such as bus, train, or  airplane) or o Sent to the Emergency Department by EMS for evaluation, COVID-19 testing, and possible admission depending on your condition and test results.  What to do if you are LOW RISK for COVID-19?  Reduce your risk of any infection by using the same precautions used for avoiding the common cold or flu:  Marland Kitchen Wash your hands often with soap and warm water for at least 20 seconds.  If soap and water are not readily available, use an alcohol-based hand sanitizer with at least 60% alcohol.  . If coughing or sneezing, cover your mouth and nose by coughing or sneezing into the elbow areas of your shirt or coat, into a tissue or into your sleeve (not your hands). . Avoid shaking hands with others and consider head nods or verbal greetings only. . Avoid touching your eyes, nose, or mouth with unwashed hands.  . Avoid close contact with people who are sick. . Avoid places or events with large numbers of people in one location, like concerts or sporting events. . Carefully consider travel plans you have or are making. . If you are planning any travel outside or inside the [place], visit the CDC's Travelers' Health webpage for the latest health notices. . If you have some symptoms but not all symptoms, continue to monitor at home and seek medical attention if your symptoms worsen. . If you are having a medical emergency, call 911.   ADDITIONAL HEALTHCARE OPTIONS FOR PATIENTS  [place] / e-Visit: [email or web address]         [facility]: [facility]: [phone]                   [facility]: [phone]

## 2019-02-19 ENCOUNTER — Other Ambulatory Visit: Payer: Self-pay

## 2019-02-19 ENCOUNTER — Other Ambulatory Visit: Payer: Commercial Managed Care - PPO

## 2019-02-19 ENCOUNTER — Ambulatory Visit (INDEPENDENT_AMBULATORY_CARE_PROVIDER_SITE_OTHER): Payer: Commercial Managed Care - PPO | Admitting: Obstetrics and Gynecology

## 2019-02-19 ENCOUNTER — Encounter: Payer: Self-pay | Admitting: Obstetrics and Gynecology

## 2019-02-19 VITALS — BP 107/77 | HR 92 | Ht 64.0 in | Wt 152.4 lb

## 2019-02-19 DIAGNOSIS — Z131 Encounter for screening for diabetes mellitus: Secondary | ICD-10-CM

## 2019-02-19 DIAGNOSIS — Z3482 Encounter for supervision of other normal pregnancy, second trimester: Secondary | ICD-10-CM

## 2019-02-19 DIAGNOSIS — Z23 Encounter for immunization: Secondary | ICD-10-CM

## 2019-02-19 DIAGNOSIS — O09299 Supervision of pregnancy with other poor reproductive or obstetric history, unspecified trimester: Secondary | ICD-10-CM

## 2019-02-19 DIAGNOSIS — Z130 Encounter for screening for diseases of the blood and blood-forming organs and certain disorders involving the immune mechanism: Secondary | ICD-10-CM

## 2019-02-19 LAB — POCT URINALYSIS DIPSTICK OB
Bilirubin, UA: NEGATIVE
Blood, UA: NEGATIVE
Glucose, UA: NEGATIVE
Ketones, UA: NEGATIVE
Leukocytes, UA: NEGATIVE
Nitrite, UA: NEGATIVE
POC,PROTEIN,UA: NEGATIVE
Spec Grav, UA: 1.01 (ref 1.010–1.025)
Urobilinogen, UA: 0.2 E.U./dL
pH, UA: 7.5 (ref 5.0–8.0)

## 2019-02-19 MED ORDER — TETANUS-DIPHTH-ACELL PERTUSSIS 5-2.5-18.5 LF-MCG/0.5 IM SUSP
0.5000 mL | Freq: Once | INTRAMUSCULAR | Status: AC
  Administered 2019-02-19: 09:00:00 0.5 mL via INTRAMUSCULAR

## 2019-02-19 NOTE — Progress Notes (Signed)
ROB:  1 hr GCT today.  No complaints.  [place].

## 2019-02-19 NOTE — Progress Notes (Signed)
Patient comes in today for ROB visit. She is having glucose test today.

## 2019-02-20 LAB — CBC
Hematocrit: 36.5 % (ref 34.0–46.6)
Hemoglobin: 12.6 g/dL (ref 11.1–15.9)
MCH: 32.6 pg (ref 26.6–33.0)
MCHC: 34.5 g/dL (ref 31.5–35.7)
MCV: 94 fL (ref 79–97)
Platelets: 147 10*3/uL — ABNORMAL LOW (ref 150–450)
RBC: 3.87 x10E6/uL (ref 3.77–5.28)
RDW: 11.6 % — ABNORMAL LOW (ref 11.7–15.4)
WBC: 8.8 10*3/uL (ref 3.4–10.8)

## 2019-02-20 LAB — GLUCOSE, 1 HOUR GESTATIONAL: Gestational Diabetes Screen: 116 mg/dL (ref 65–139)

## 2019-02-20 LAB — RPR: RPR Ser Ql: NONREACTIVE

## 2019-03-11 ENCOUNTER — Telehealth: Payer: Self-pay

## 2019-03-11 NOTE — Telephone Encounter (Signed)
Pt prescreened no symptoms. Pt has face mask and is aware of no visitor policy.  Coronavirus (COVID-19) Are you at risk?  Are you at risk for the Coronavirus (COVID-19)?  To be considered HIGH RISK for Coronavirus (COVID-19), you have to meet the following criteria:  . Traveled to [place], [place], [place], [place] or [place]; or in the [place] to [place], [place], [place], or [place]; and have fever, cough, and shortness of breath within the last 2 weeks of travel OR . Been in close contact with a person diagnosed with COVID-19 within the last 2 weeks and have fever, cough, and shortness of breath . IF YOU DO NOT MEET THESE CRITERIA, YOU ARE CONSIDERED LOW RISK FOR COVID-19.  What to do if you are HIGH RISK for COVID-19?  Marland Kitchen If you are having a medical emergency, call 911. . Seek medical care right away. Before you go to a doctor's office, urgent care or emergency department, call ahead and tell them about your recent travel, contact with someone diagnosed with COVID-19, and your symptoms. You should receive instructions from your physician's office regarding next steps of care.  . When you arrive at healthcare provider, tell the healthcare staff immediately you have returned from visiting [place], [place], [place], [place] or [place]; or traveled in the [place] to [place], [place], [place], or [place]; in the last two weeks or you have been in close contact with a person diagnosed with COVID-19 in the last 2 weeks.   . Tell the health care staff about your symptoms: fever, cough and shortness of breath. . After you have been seen by a medical provider, you will be either: o Tested for (COVID-19) and discharged home on quarantine except to seek medical care if symptoms worsen, and asked to  - Stay home and avoid contact with others until you get your results (4-5 days)  - Avoid travel on public transportation if possible (such as bus, train, or airplane) or o Sent to the  Emergency Department by EMS for evaluation, COVID-19 testing, and possible admission depending on your condition and test results.  What to do if you are LOW RISK for COVID-19?  Reduce your risk of any infection by using the same precautions used for avoiding the common cold or flu:  Marland Kitchen Wash your hands often with soap and warm water for at least 20 seconds.  If soap and water are not readily available, use an alcohol-based hand sanitizer with at least 60% alcohol.  . If coughing or sneezing, cover your mouth and nose by coughing or sneezing into the elbow areas of your shirt or coat, into a tissue or into your sleeve (not your hands). . Avoid shaking hands with others and consider head nods or verbal greetings only. . Avoid touching your eyes, nose, or mouth with unwashed hands.  . Avoid close contact with people who are sick. . Avoid places or events with large numbers of people in one location, like concerts or sporting events. . Carefully consider travel plans you have or are making. . If you are planning any travel outside or inside the [place], visit the CDC's Travelers' Health webpage for the latest health notices. . If you have some symptoms but not all symptoms, continue to monitor at home and seek medical attention if your symptoms worsen. . If you are having a medical emergency, call 911.   ADDITIONAL HEALTHCARE OPTIONS FOR PATIENTS  [facility] / e-Visit: [email or web address]  [facility]: [phone]  [facility]: [phone]                   [facility]: [phone]  

## 2019-03-11 NOTE — Patient Instructions (Signed)

## 2019-03-11 NOTE — Progress Notes (Signed)
ROB-Pt is present today for prenatal care. Pt stated that she was doing well and having some braxton hick contractions off and on.

## 2019-03-12 ENCOUNTER — Encounter: Payer: Self-pay | Admitting: Obstetrics and Gynecology

## 2019-03-12 ENCOUNTER — Ambulatory Visit: Payer: Commercial Managed Care - PPO | Admitting: Obstetrics and Gynecology

## 2019-03-12 ENCOUNTER — Other Ambulatory Visit: Payer: Self-pay

## 2019-03-12 VITALS — BP 107/93 | HR 82 | Wt 153.1 lb

## 2019-03-12 DIAGNOSIS — Z8632 Personal history of gestational diabetes: Secondary | ICD-10-CM

## 2019-03-12 DIAGNOSIS — O09293 Supervision of pregnancy with other poor reproductive or obstetric history, third trimester: Secondary | ICD-10-CM

## 2019-03-12 DIAGNOSIS — D696 Thrombocytopenia, unspecified: Secondary | ICD-10-CM | POA: Insufficient documentation

## 2019-03-12 DIAGNOSIS — Z3483 Encounter for supervision of other normal pregnancy, third trimester: Secondary | ICD-10-CM

## 2019-03-12 DIAGNOSIS — O99113 Other diseases of the blood and blood-forming organs and certain disorders involving the immune mechanism complicating pregnancy, third trimester: Secondary | ICD-10-CM

## 2019-03-12 DIAGNOSIS — Z3490 Encounter for supervision of normal pregnancy, unspecified, unspecified trimester: Secondary | ICD-10-CM | POA: Insufficient documentation

## 2019-03-12 DIAGNOSIS — O09299 Supervision of pregnancy with other poor reproductive or obstetric history, unspecified trimester: Secondary | ICD-10-CM | POA: Insufficient documentation

## 2019-03-12 LAB — POCT URINALYSIS DIPSTICK OB
Bilirubin, UA: NEGATIVE
Blood, UA: NEGATIVE
Glucose, UA: NEGATIVE
Ketones, UA: NEGATIVE
Leukocytes, UA: NEGATIVE
Nitrite, UA: NEGATIVE
POC,PROTEIN,UA: NEGATIVE
Spec Grav, UA: <=1.005 — AB (ref 1.010–1.025)
Urobilinogen, UA: 0.2 E.U./dL
pH, UA: 7 (ref 5.0–8.0)

## 2019-03-12 NOTE — Progress Notes (Signed)
ROB: doing well, no complaints. Discussed COVID and pregnancy.  Mild gestational thrombocytopenia, will need repeat platelets at 36 weeks. Repeat 1 hr glucola normal.  Discussed methods for helping baby change to vertex. RTC in 3 weeks. For 36 week labs at that time.

## 2019-04-03 ENCOUNTER — Encounter: Payer: Self-pay | Admitting: Obstetrics and Gynecology

## 2019-04-03 ENCOUNTER — Other Ambulatory Visit: Payer: Self-pay

## 2019-04-03 ENCOUNTER — Ambulatory Visit (INDEPENDENT_AMBULATORY_CARE_PROVIDER_SITE_OTHER): Payer: Commercial Managed Care - PPO | Admitting: Obstetrics and Gynecology

## 2019-04-03 VITALS — BP 120/82 | HR 81 | Ht 64.0 in | Wt 155.2 lb

## 2019-04-03 DIAGNOSIS — O99113 Other diseases of the blood and blood-forming organs and certain disorders involving the immune mechanism complicating pregnancy, third trimester: Secondary | ICD-10-CM

## 2019-04-03 DIAGNOSIS — Z3483 Encounter for supervision of other normal pregnancy, third trimester: Secondary | ICD-10-CM

## 2019-04-03 DIAGNOSIS — D696 Thrombocytopenia, unspecified: Secondary | ICD-10-CM

## 2019-04-03 LAB — CBC, NO DIFFERENTIAL/PLATELET
Hematocrit: 35.7 % (ref 34.0–46.6)
Hemoglobin: 12.5 g/dL (ref 11.1–15.9)
MCH: 32.1 pg (ref 26.6–33.0)
MCHC: 35 g/dL (ref 31.5–35.7)
MCV: 92 fL (ref 79–97)
RBC: 3.89 x10E6/uL (ref 3.77–5.28)
RDW: 12.1 % (ref 11.7–15.4)
WBC: 8.6 10*3/uL (ref 3.4–10.8)

## 2019-04-03 LAB — POCT URINALYSIS DIPSTICK OB
Bilirubin, UA: NEGATIVE
Blood, UA: NEGATIVE
Glucose, UA: NEGATIVE
Ketones, UA: NEGATIVE
Leukocytes, UA: NEGATIVE
Nitrite, UA: NEGATIVE
POC,PROTEIN,UA: NEGATIVE
Spec Grav, UA: 1.01 (ref 1.010–1.025)
Urobilinogen, UA: 0.2 E.U./dL
pH, UA: 6.5 (ref 5.0–8.0)

## 2019-04-03 NOTE — Progress Notes (Signed)
Patient comes in today for ROB visit. No concerns.  

## 2019-04-03 NOTE — Progress Notes (Signed)
ROB: Patient without complaint.  She says her baby changes position from breech to vertex regularly.  Vertex today without doubt.  GBS-GC/CT performed.  Platelet count ordered for gestational thrombocytopenia.  Discussed possibility of version if necessary.

## 2019-04-03 NOTE — Addendum Note (Signed)
Addended by: Durwin Glaze on: 04/03/2019 09:05 AM   Modules accepted: Orders

## 2019-04-05 LAB — STREP GP B NAA: Strep Gp B NAA: POSITIVE — AB

## 2019-04-05 LAB — GC/CHLAMYDIA PROBE AMP
Chlamydia trachomatis, NAA: NEGATIVE
Neisseria Gonorrhoeae by PCR: NEGATIVE

## 2019-04-09 ENCOUNTER — Telehealth: Payer: Self-pay

## 2019-04-09 NOTE — Telephone Encounter (Signed)
Coronavirus (COVID-19) Are you at risk?  Are you at risk for the Coronavirus (COVID-19)?  To be considered HIGH RISK for Coronavirus (COVID-19), you have to meet the following criteria:  . Traveled to [place], [place], [place], [place] or [place]; or in the [place] to [place], [place], [place], or [place]; and have fever, cough, and shortness of breath within the last 2 weeks of travel OR . Been in close contact with a person diagnosed with COVID-19 within the last 2 weeks and have fever, cough, and shortness of breath . IF YOU DO NOT MEET THESE CRITERIA, YOU ARE CONSIDERED LOW RISK FOR COVID-19.  What to do if you are HIGH RISK for COVID-19?  . If you are having a medical emergency, call 911. . Seek medical care right away. Before you go to a doctor's office, urgent care or emergency department, call ahead and tell them about your recent travel, contact with someone diagnosed with COVID-19, and your symptoms. You should receive instructions from your physician's office regarding next steps of care.  . When you arrive at healthcare provider, tell the healthcare staff immediately you have returned from visiting [place], [place], [place], [place] or [place]; or traveled in the [place] to [place], [place], [place], or [place]; in the last two weeks or you have been in close contact with a person diagnosed with COVID-19 in the last 2 weeks.   . Tell the health care staff about your symptoms: fever, cough and shortness of breath. . After you have been seen by a medical provider, you will be either: o Tested for (COVID-19) and discharged home on quarantine except to seek medical care if symptoms worsen, and asked to  - Stay home and avoid contact with others until you get your results (4-5 days)  - Avoid travel on public transportation if possible (such as bus, train, or airplane) or o Sent to the Emergency Department by EMS for evaluation, COVID-19 testing, and possible  admission depending on your condition and test results.  What to do if you are LOW RISK for COVID-19?  Reduce your risk of any infection by using the same precautions used for avoiding the common cold or flu:  . Wash your hands often with soap and warm water for at least 20 seconds.  If soap and water are not readily available, use an alcohol-based hand sanitizer with at least 60% alcohol.  . If coughing or sneezing, cover your mouth and nose by coughing or sneezing into the elbow areas of your shirt or coat, into a tissue or into your sleeve (not your hands). . Avoid shaking hands with others and consider head nods or verbal greetings only. . Avoid touching your eyes, nose, or mouth with unwashed hands.  . Avoid close contact with people who are Danielle Chen. . Avoid places or events with large numbers of people in one location, like concerts or sporting events. . Carefully consider travel plans you have or are making. . If you are planning any travel outside or inside the [place], visit the CDC's Travelers' Health webpage for the latest health notices. . If you have some symptoms but not all symptoms, continue to monitor at home and seek medical attention if your symptoms worsen. . If you are having a medical emergency, call 911.  04/09/19 SCREENING NEG SLS ADDITIONAL HEALTHCARE OPTIONS FOR PATIENTS  [facility] / e-Visit: [email or web address]         [facility]: [phone]    [facility]: [phone]                   [facility]: [phone]  

## 2019-04-10 ENCOUNTER — Other Ambulatory Visit (INDEPENDENT_AMBULATORY_CARE_PROVIDER_SITE_OTHER): Payer: Commercial Managed Care - PPO

## 2019-04-10 ENCOUNTER — Ambulatory Visit (INDEPENDENT_AMBULATORY_CARE_PROVIDER_SITE_OTHER): Payer: Commercial Managed Care - PPO | Admitting: Obstetrics and Gynecology

## 2019-04-10 ENCOUNTER — Other Ambulatory Visit: Payer: Self-pay

## 2019-04-10 ENCOUNTER — Encounter: Payer: Self-pay | Admitting: Obstetrics and Gynecology

## 2019-04-10 VITALS — BP 127/72 | HR 94 | Wt 156.6 lb

## 2019-04-10 DIAGNOSIS — O322XX0 Maternal care for transverse and oblique lie, not applicable or unspecified: Secondary | ICD-10-CM

## 2019-04-10 DIAGNOSIS — Z3A37 37 weeks gestation of pregnancy: Secondary | ICD-10-CM | POA: Diagnosis not present

## 2019-04-10 DIAGNOSIS — D696 Thrombocytopenia, unspecified: Secondary | ICD-10-CM

## 2019-04-10 DIAGNOSIS — O99820 Streptococcus B carrier state complicating pregnancy: Secondary | ICD-10-CM

## 2019-04-10 DIAGNOSIS — O99113 Other diseases of the blood and blood-forming organs and certain disorders involving the immune mechanism complicating pregnancy, third trimester: Secondary | ICD-10-CM

## 2019-04-10 DIAGNOSIS — Z3483 Encounter for supervision of other normal pregnancy, third trimester: Secondary | ICD-10-CM

## 2019-04-10 LAB — POCT URINALYSIS DIPSTICK OB
Bilirubin, UA: NEGATIVE
Blood, UA: NEGATIVE
Glucose, UA: NEGATIVE
Ketones, UA: NEGATIVE
Leukocytes, UA: NEGATIVE
Nitrite, UA: NEGATIVE
POC,PROTEIN,UA: NEGATIVE
Spec Grav, UA: 1.01 (ref 1.010–1.025)
Urobilinogen, UA: 0.2 E.U./dL
pH, UA: 7 (ref 5.0–8.0)

## 2019-04-10 NOTE — Progress Notes (Signed)
ROB: Patient notes more intense Braxton Hicks contractions yesterday. Has been feeling good movement. Also noting pelvic pressure. Exam today with concern for fetal malpresentation, ultrasound performed today confirms transverse presentation (head to maternal left), normal AFI. Discussed options with patient including position changes ([email or web address]), moxibustion, chiropractor referral, ECV, or C-section. Patient notes she will try position changes and moxibustion. If no change in the next several days, will proceed with ECV.  To schedule ECV for Monday 04/15/19 at 10:00 a.m. Patient is GBS+, discussed need for antibiotics in labor. Gestational thrombocytopenia, needs repeat platelet count. Ordered today.

## 2019-04-10 NOTE — Progress Notes (Signed)
ROB-pt present for prenatal care. Pt stated that she was doing well other than having a lot of vaginal pain.

## 2019-04-10 NOTE — Patient Instructions (Signed)
Group B Streptococcus Infection During Pregnancy  Group B Streptococcus (GBS) is a type of bacteria (Streptococcus agalactiae) that is often found in healthy people, commonly in the rectum, vagina, and intestines. In people who are healthy and not pregnant, the bacteria rarely cause serious illness or complications. However, women who test positive for GBS during pregnancy can pass the bacteria to their baby during childbirth, which can cause serious infection in the baby after birth. Women with GBS may also have infections during their pregnancy or immediately after childbirth, such as urinary tract infections (UTIs) or infections of the uterus (uterine infections). Having GBS also increases a woman's risk of complications during pregnancy, such as early (preterm) labor or delivery, miscarriage, or stillbirth. Routine testing (screening) for GBS is recommended for all pregnant women. What increases the risk? You may have a higher risk for GBS infection during pregnancy if you had one during a past pregnancy. What are the signs or symptoms? In most cases, GBS infection does not cause symptoms in pregnant women. Signs and symptoms of a possible GBS-related infection may include:  Labor starting before the 37th week of pregnancy.  A UTI or bladder infection, which may cause: ? Fever. ? Pain or burning during urination. ? Frequent urination.  Fever during labor, along with: ? Bad-smelling discharge. ? Uterine tenderness. ? Rapid heartbeat in the mother, baby, or both. Rare but serious symptoms of a possible GBS-related infection in women include:  Blood infection (septicemia). This may cause fever, chills, or confusion.  Lung infection (pneumonia). This may cause fever, chills, cough, rapid breathing, difficulty breathing, or chest pain.  Bone, joint, skin, or soft tissue infection. How is this diagnosed? You may be screened for GBS between week 35 and week 37 of your pregnancy. If you have  symptoms of preterm labor, you may be screened earlier. This condition is diagnosed based on lab test results from:  A swab of fluid from the vagina and rectum.  A urine sample. How is this treated? This condition is treated with antibiotic medicine. When you go into labor, or as soon as your water breaks (your membranes rupture), you will be given antibiotics through an IV tube. Antibiotics will continue until after you give birth. If you are having a cesarean delivery, you do not need antibiotics unless your membranes have already ruptured. Follow these instructions at home:  Take over-the-counter and prescription medicines only as told by your health care provider.  Take your antibiotic medicine as told by your health care provider. Do not stop taking the antibiotic even if you start to feel better.  Keep all pre-birth (prenatal) visits and follow-up visits as told by your health care provider. This is important. Contact a health care provider if:  You have pain or burning when you urinate.  You have to urinate frequently.  You have a fever or chills.  You develop a bad-smelling vaginal discharge. Get help right away if:  Your membranes rupture.  You go into labor.  You have severe pain in your abdomen.  You have difficulty breathing.  You have chest pain. This information is not intended to replace advice given to you by your health care provider. Make sure you discuss any questions you have with your health care provider. Document Released: 01/03/2008 Document Revised: 01/17/2019 Document Reviewed: 04/21/2016 Elsevier Patient Education  2020 Elsevier Inc.  

## 2019-04-11 LAB — PLATELET COUNT: Platelets: 154 10*3/uL (ref 150–450)

## 2019-04-15 ENCOUNTER — Observation Stay
Admission: RE | Admit: 2019-04-15 | Discharge: 2019-04-15 | Disposition: A | Discharge status: Home / Self Care | Payer: Commercial Managed Care - PPO | Attending: Obstetrics and Gynecology | Admitting: Obstetrics and Gynecology

## 2019-04-15 ENCOUNTER — Other Ambulatory Visit: Payer: Self-pay

## 2019-04-15 DIAGNOSIS — Z3A38 38 weeks gestation of pregnancy: Secondary | ICD-10-CM | POA: Insufficient documentation

## 2019-04-15 DIAGNOSIS — O99113 Other diseases of the blood and blood-forming organs and certain disorders involving the immune mechanism complicating pregnancy, third trimester: Secondary | ICD-10-CM | POA: Insufficient documentation

## 2019-04-15 DIAGNOSIS — O99820 Streptococcus B carrier state complicating pregnancy: Secondary | ICD-10-CM | POA: Diagnosis not present

## 2019-04-15 DIAGNOSIS — D6959 Other secondary thrombocytopenia: Secondary | ICD-10-CM | POA: Diagnosis not present

## 2019-04-15 DIAGNOSIS — O322XX0 Maternal care for transverse and oblique lie, not applicable or unspecified: Secondary | ICD-10-CM | POA: Diagnosis present

## 2019-04-15 LAB — SAMPLE TO BLOOD BANK

## 2019-04-15 MED ORDER — LACTATED RINGERS IV BOLUS
1000.0000 mL | Freq: Once | INTRAVENOUS | Status: AC
  Administered 2019-04-15: 1000 mL via INTRAVENOUS

## 2019-04-15 MED ORDER — MINERAL OIL PO OIL
45.0000 mL | TOPICAL_OIL | Freq: Once | ORAL | Status: DC
  Filled 2019-04-15: qty 45

## 2019-04-15 MED ORDER — TERBUTALINE SULFATE 1 MG/ML IJ SOLN
0.2500 mg | Freq: Once | INTRAMUSCULAR | Status: DC
  Filled 2019-04-15: qty 1

## 2019-04-15 NOTE — OB Triage Note (Signed)
Pt presents to L&D for an external version. Pt denies leaking of fluid, Vaginal bleeding, or contractions. Pt states positive fetal movement. External monitors applied and assessing. Initial FHR 170. VS WNL. Dr, Marcelline Mates notified of pt.

## 2019-04-15 NOTE — Discharge Instructions (Signed)
Fetal Positions  In the final weeks of your pregnancy, your baby usually moves into a head-down (vertex) position to get ready for birth. As a normal delivery proceeds through the stages of labor, the baby tucks in the chin and turns to face your back. In this position, the back of your baby's head starts to show (crown) first through your open cervix. Sometimes your baby may be in a different, abnormal position just before birth. These positions are called malpositions or malpresentations. Giving birth can be more difficult if your baby is in an abnormal position. What are abnormal fetal positions? There are five main abnormal fetal positions:  Occiput posterior presentation. This is the most common abnormal fetal position. It is sometimes called the "sunny-side up" position because your baby's face points toward your front instead of your back.  Breech presentation. This is also common. In this position, your baby's bottom or feet are in position to come out first.  Face or brow presentation. In this position, your baby is head down, but the face or the front of the head crowns first.  Compound presentation. In this position, your baby's hand or leg comes out along with the head or bottom.  Transverse presentation. In this position, your baby is lying sideways across your birth canal. Your baby's shoulder may come out first. Your health care provider can diagnose an abnormal fetal position during a physical exam as your due date approaches. An abnormal fetal position may be found by feeling your belly and by doing an internal (pelvic) exam. A sound wave imaging study (fetal ultrasound) can be done to confirm the abnormal position. What causes an abnormal fetal position? In many cases, the cause for an abnormal fetal position is not known. You may be at higher risk of having a baby in an abnormal fetal position if:  You have an abnormally shaped womb (uterus) or pelvis.  You have growths in  your uterus, such as fibroids.  Your placenta is large or in an abnormal position.  You are having twins or multiples.  You have too much amniotic fluid.  Your baby has some type of developmental abnormality.  You go into early (premature) labor. How does this affect me? In some cases, your baby may move into a vertex position just before or during labor. However, an abnormal fetal position increases your risk for a long labor or the need for steps to be taken to help ensure a safe delivery. Your health care provider may need to:  Turn the baby manually by pushing on your belly (external cephalic version). ? Use instruments, such as forceps or a suctioning device, to help get your baby through the birth canal (assisted delivery). ? Deliver your baby by cesarean delivery, also called a C-section. The exact effects on your delivery will depend on the position your baby is in right before birth. If your baby has an occiput posterior presentation:  You may have to push harder and may have a longer labor.  You may have more back pain.  You may deliver vaginally, but you are more likely to need an assisted delivery.  You may need a cesarean delivery. If your baby is breech:  Your health care provider may try a vaginal delivery, but there is a risk that your baby's umbilical cord will be stretched or compressed and your baby will not get enough oxygen.  In most cases, you will need a cesarean delivery. If your baby is in a face or  brow presentation:  Your labor may be longer.  You may be able to have a vaginal or assisted vaginal delivery.  There is a higher-than-normal risk that you will need a cesarean delivery. If your baby is in a compound presentation:  Your health care provider may be able to change your baby's position manually.  In most cases, this position requires a cesarean delivery. If your baby is in a transverse presentation:  Your health care provider may be able  to turn your baby manually.  In most cases, this position requires a cesarean delivery. How does this affect my baby? Most babies are not affected by an abnormal fetal position, but there is a higher risk of some complications, including:  Swelling and bruising.  Birth injuries.  Not getting enough oxygen during birth. Summary  The normal fetal position for birth is head down and facing toward your back (vertex position).  Abnormal fetal positions include occiput posterior, breech, face or brow presentation, compound presentation, and transverse position.  In some cases, your baby may move into a normal position before birth, or your health care provider may be able to change your baby's position manually.  If your baby is in an abnormal fetal position at the time of birth, you have a greater risk of a longer labor, assisted delivery, and cesarean delivery. This information is not intended to replace advice given to you by your health care provider. Make sure you discuss any questions you have with your health care provider. Document Released: 07/12/2017 Document Revised: 01/17/2019 Document Reviewed: 07/12/2017 Elsevier Patient Education  2020 Elsevier Inc.  

## 2019-04-15 NOTE — Final Progress Note (Signed)
L&D OB Triage Note  HPI:  Danielle Chen is a 33 y.o. G2P1001 female at [redacted]w[redacted]d. Estimated Date of Delivery: 04/28/19 who presents for scheduled external cephalic version for fetal malpresentation (transverse with head to maternal left noted on ultrasound performed 04/10/2019.  Patient notes that she has been trying different positions to help turn fetus as recommended at last visit. She denies contractions, leakage of fluids, or vaginal bleeding, and notes good fetal movement.   OB History  Gravida Para Term Preterm AB Living  2 1 1     1   SAB TAB Ectopic Multiple Live Births          1    # Outcome Date GA Lbr Len/2nd Weight Sex Delivery Anes PTL Lv  2 Current           1 Term 08/30/17    Danielle Chen Vag-Spont   LIV    Patient Active Problem List   Diagnosis Date Noted  . Transverse presentation, antepartum 04/15/2019  . Group B streptococcal carriage complicating pregnancy 21/30/8657  . Transverse or oblique presentation of fetus 04/10/2019  . Gestational thrombocytopenia, third trimester ([place]) 03/12/2019  . Supervision of normal pregnancy 03/12/2019  . History of gestational diabetes in prior pregnancy, currently pregnant 03/12/2019    Past Medical History:  Diagnosis Date  . Gestational diabetes     No current facility-administered medications on file prior to encounter.    Current Outpatient Medications on File Prior to Encounter  Medication Sig Dispense Refill  . Prenatal Vit-Fe Fumarate-FA (PRENATAL MULTIVITAMIN) TABS tablet Take 1 tablet by mouth daily at 12 noon.      No Known Allergies   ROS:  Review of Systems - Negative except Negative except where noted in HPI.    Physical Exam:  Blood pressure 130/87, pulse 97, temperature 98.7 F (37.1 C), temperature source Oral, resp. rate 18, height 5\' 4"  (1.626 m), weight 70.8 kg, last menstrual period 07/12/2018. General appearance: alert and no distress Abdomen: soft, non-tender; bowel sounds normal; no masses,  no  organomegaly.  Gravid. Pelvic: deferred Extremities: extremities normal, atraumatic, no cyanosis or edema  Neurologic: grossly intact   NST INTERPRETATION: Indications: rule out uterine contractions. Pre-procedural evaluation.   Mode: External Baseline Rate (A): 145 bpm Variability: Moderate Accelerations: None Decelerations: None     Contraction Frequency (min): none noted  Impression: reactive     Bedside sono performed today just prior to initiation of procedure, with cephalic presentation noted (although asynclitic).  Assessment:  33 y.o. G2P1001 at [redacted]w[redacted]d with:  1.  Cephalic presentation (previously malpresentation)   Plan:  1. Patient can d/c home. No longer requires version.  Advised on belly binding.  2. To f/u this week in office for OB appointment.    Rubie Maid, MD [facility]

## 2019-04-17 ENCOUNTER — Ambulatory Visit (INDEPENDENT_AMBULATORY_CARE_PROVIDER_SITE_OTHER): Payer: Commercial Managed Care - PPO | Admitting: Obstetrics and Gynecology

## 2019-04-17 ENCOUNTER — Encounter: Payer: Self-pay | Admitting: Obstetrics and Gynecology

## 2019-04-17 ENCOUNTER — Other Ambulatory Visit: Payer: Self-pay

## 2019-04-17 VITALS — BP 120/87 | HR 79 | Ht 64.0 in | Wt 157.9 lb

## 2019-04-17 DIAGNOSIS — Z3483 Encounter for supervision of other normal pregnancy, third trimester: Secondary | ICD-10-CM

## 2019-04-17 LAB — POCT URINALYSIS DIPSTICK OB
Bilirubin, UA: NEGATIVE
Blood, UA: NEGATIVE
Glucose, UA: NEGATIVE
Ketones, UA: NEGATIVE
Leukocytes, UA: NEGATIVE
Nitrite, UA: NEGATIVE
POC,PROTEIN,UA: NEGATIVE
Spec Grav, UA: 1.01 (ref 1.010–1.025)
Urobilinogen, UA: 0.2 E.U./dL
pH, UA: 7 (ref 5.0–8.0)

## 2019-04-17 NOTE — Progress Notes (Signed)
Patient comes in today for ROB visit. No concerns today. 

## 2019-04-17 NOTE — Progress Notes (Signed)
ROB: Patient with occasional contractions but none today.  Baby straight vertex.  Signs and symptoms of labor discussed.  Platelets normal.

## 2019-04-22 ENCOUNTER — Telehealth: Payer: Self-pay

## 2019-04-22 NOTE — Telephone Encounter (Signed)
Pt called no answer LM via voicemail to call the office for prescreening/sent mychart.

## 2019-04-23 ENCOUNTER — Ambulatory Visit (INDEPENDENT_AMBULATORY_CARE_PROVIDER_SITE_OTHER): Payer: Commercial Managed Care - PPO | Admitting: Obstetrics and Gynecology

## 2019-04-23 ENCOUNTER — Inpatient Hospital Stay
Admission: EM | Admit: 2019-04-23 | Discharge: 2019-04-25 | DRG: 807 | Disposition: A | Discharge status: Home / Self Care | Payer: Commercial Managed Care - PPO | Attending: Obstetrics and Gynecology | Admitting: Obstetrics and Gynecology

## 2019-04-23 ENCOUNTER — Encounter: Payer: Self-pay | Admitting: Obstetrics and Gynecology

## 2019-04-23 ENCOUNTER — Other Ambulatory Visit: Payer: Self-pay

## 2019-04-23 VITALS — BP 134/88 | HR 93 | Wt 158.5 lb

## 2019-04-23 DIAGNOSIS — O4202 Full-term premature rupture of membranes, onset of labor within 24 hours of rupture: Secondary | ICD-10-CM | POA: Diagnosis not present

## 2019-04-23 DIAGNOSIS — Z1159 Encounter for screening for other viral diseases: Secondary | ICD-10-CM | POA: Diagnosis not present

## 2019-04-23 DIAGNOSIS — O99824 Streptococcus B carrier state complicating childbirth: Secondary | ICD-10-CM | POA: Diagnosis present

## 2019-04-23 DIAGNOSIS — O480 Post-term pregnancy: Secondary | ICD-10-CM

## 2019-04-23 DIAGNOSIS — O322XX0 Maternal care for transverse and oblique lie, not applicable or unspecified: Secondary | ICD-10-CM | POA: Diagnosis present

## 2019-04-23 DIAGNOSIS — O99119 Other diseases of the blood and blood-forming organs and certain disorders involving the immune mechanism complicating pregnancy, unspecified trimester: Principal | ICD-10-CM | POA: Diagnosis present

## 2019-04-23 DIAGNOSIS — Z3A39 39 weeks gestation of pregnancy: Secondary | ICD-10-CM

## 2019-04-23 DIAGNOSIS — O99820 Streptococcus B carrier state complicating pregnancy: Secondary | ICD-10-CM

## 2019-04-23 DIAGNOSIS — O26893 Other specified pregnancy related conditions, third trimester: Secondary | ICD-10-CM | POA: Diagnosis present

## 2019-04-23 DIAGNOSIS — Z3483 Encounter for supervision of other normal pregnancy, third trimester: Secondary | ICD-10-CM

## 2019-04-23 DIAGNOSIS — D6959 Other secondary thrombocytopenia: Secondary | ICD-10-CM | POA: Diagnosis present

## 2019-04-23 LAB — CBC
HCT: 37.7 % (ref 36.0–46.0)
Hemoglobin: 12.8 g/dL (ref 12.0–15.0)
MCH: 32.4 pg (ref 26.0–34.0)
MCHC: 34 g/dL (ref 30.0–36.0)
MCV: 95.4 fL (ref 80.0–100.0)
Platelets: 171 10*3/uL (ref 150–400)
RBC: 3.95 MIL/uL (ref 3.87–5.11)
RDW: 12.4 % (ref 11.5–15.5)
WBC: 13.6 10*3/uL — ABNORMAL HIGH (ref 4.0–10.5)
nRBC: 0 % (ref 0.0–0.2)

## 2019-04-23 LAB — TYPE AND SCREEN
ABO/RH(D): O POS
Antibody Screen: NEGATIVE

## 2019-04-23 LAB — POCT URINALYSIS DIPSTICK OB
Bilirubin, UA: NEGATIVE
Blood, UA: NEGATIVE
Glucose, UA: NEGATIVE
Ketones, UA: NEGATIVE
Leukocytes, UA: NEGATIVE
Nitrite, UA: NEGATIVE
POC,PROTEIN,UA: NEGATIVE
Spec Grav, UA: <=1.005 — AB (ref 1.010–1.025)
Urobilinogen, UA: 0.2 E.U./dL
pH, UA: 6.5 (ref 5.0–8.0)

## 2019-04-23 LAB — RUPTURE OF MEMBRANE (ROM)PLUS: Rom Plus: POSITIVE

## 2019-04-23 LAB — SARS CORONAVIRUS 2 BY RT PCR (HOSPITAL ORDER, PERFORMED IN ~~LOC~~ HOSPITAL LAB): SARS Coronavirus 2: NEGATIVE

## 2019-04-23 MED ORDER — LIDOCAINE HCL (PF) 1 % IJ SOLN: 30.0000 mL | INTRAMUSCULAR | Status: DC | PRN

## 2019-04-23 MED ORDER — LACTATED RINGERS IV SOLN: 500.0000 mL | INTRAVENOUS | Status: DC | PRN

## 2019-04-23 MED ORDER — MISOPROSTOL 200 MCG PO TABS
ORAL_TABLET | ORAL | Status: AC
  Filled 2019-04-23: qty 4

## 2019-04-23 MED ORDER — LIDOCAINE HCL (PF) 1 % IJ SOLN
INTRAMUSCULAR | Status: AC
  Administered 2019-04-24: 30 mL
  Filled 2019-04-23: qty 30

## 2019-04-23 MED ORDER — OXYTOCIN 10 UNIT/ML IJ SOLN
INTRAMUSCULAR | Status: AC
  Filled 2019-04-23: qty 2

## 2019-04-23 MED ORDER — OXYTOCIN 40 UNITS IN NORMAL SALINE INFUSION - SIMPLE MED
2.5000 [IU]/h | INTRAVENOUS | Status: DC
  Administered 2019-04-24: 2.5 [IU]/h via INTRAVENOUS
  Filled 2019-04-23 (×2): qty 1000

## 2019-04-23 MED ORDER — LACTATED RINGERS IV SOLN
INTRAVENOUS | Status: DC
  Administered 2019-04-23 – 2019-04-24 (×3): via INTRAVENOUS

## 2019-04-23 MED ORDER — SODIUM CHLORIDE 0.9 % IV SOLN
2.0000 g | Freq: Once | INTRAVENOUS | Status: AC
  Administered 2019-04-23: 2 g via INTRAVENOUS
  Filled 2019-04-23: qty 2000

## 2019-04-23 MED ORDER — ACETAMINOPHEN 325 MG PO TABS: 650.0000 mg | ORAL_TABLET | ORAL | Status: DC | PRN

## 2019-04-23 MED ORDER — ONDANSETRON HCL 4 MG/2ML IJ SOLN: 4.0000 mg | Freq: Four times a day (QID) | INTRAMUSCULAR | Status: DC | PRN

## 2019-04-23 MED ORDER — AMMONIA AROMATIC IN INHA
RESPIRATORY_TRACT | Status: AC
  Filled 2019-04-23: qty 10

## 2019-04-23 MED ORDER — BUTORPHANOL TARTRATE 2 MG/ML IJ SOLN: 1.0000 mg | INTRAMUSCULAR | Status: DC | PRN

## 2019-04-23 MED ORDER — SOD CITRATE-CITRIC ACID 500-334 MG/5ML PO SOLN: 30.0000 mL | ORAL | Status: DC | PRN

## 2019-04-23 MED ORDER — SODIUM CHLORIDE 0.9 % IV SOLN
1.0000 g | INTRAVENOUS | Status: DC
  Administered 2019-04-24 (×2): 1 g via INTRAVENOUS
  Filled 2019-04-23 (×2): qty 1000

## 2019-04-23 MED ORDER — OXYTOCIN BOLUS FROM INFUSION
500.0000 mL | Freq: Once | INTRAVENOUS | Status: AC
  Administered 2019-04-24: 500 mL via INTRAVENOUS

## 2019-04-23 NOTE — Patient Instructions (Signed)

## 2019-04-23 NOTE — Progress Notes (Signed)
ROB-Pt present for prenatal care. Pt stated having a lot of pressure in the vaginal and pelvic area. No other problems.

## 2019-04-23 NOTE — Progress Notes (Signed)
ROB: Patient doing well.  Notes pelvic pressure. Desires membrane sweeping (states she had it done with her other 2 pregnancies as well).  Membrane sweeping performed. Discussed labor precautions. RTC in 1 week for OB visit if undelivered, will need growth scan and NST for postdates.

## 2019-04-23 NOTE — OB Triage Note (Signed)
Pt is a G2P1 seen at 39wk2d w/ a c/o of LOF and ctx. Pt was seen at the office today around 1600 and got her membranes swept. Pt states once her membranes were swept, her ctx started to become more intense. Pt noticed the LOF after her appointment. Pt rates her pain a 6/10. Pt states positive fetal movement. FHT 151. Monitors applied and assessing

## 2019-04-24 ENCOUNTER — Encounter: Payer: Self-pay | Admitting: *Deleted

## 2019-04-24 DIAGNOSIS — O99824 Streptococcus B carrier state complicating childbirth: Secondary | ICD-10-CM

## 2019-04-24 DIAGNOSIS — O4202 Full-term premature rupture of membranes, onset of labor within 24 hours of rupture: Secondary | ICD-10-CM

## 2019-04-24 DIAGNOSIS — Z3A39 39 weeks gestation of pregnancy: Secondary | ICD-10-CM

## 2019-04-24 MED ORDER — DOCUSATE SODIUM 100 MG PO CAPS
100.0000 mg | ORAL_CAPSULE | Freq: Two times a day (BID) | ORAL | Status: DC
  Filled 2019-04-24: qty 1

## 2019-04-24 MED ORDER — ZOLPIDEM TARTRATE 5 MG PO TABS: 5.0000 mg | ORAL_TABLET | Freq: Every evening | ORAL | Status: DC | PRN

## 2019-04-24 MED ORDER — ACETAMINOPHEN 325 MG PO TABS: 650.0000 mg | ORAL_TABLET | ORAL | Status: DC | PRN

## 2019-04-24 MED ORDER — BENZOCAINE-MENTHOL 20-0.5 % EX AERO: 1.0000 "application " | INHALATION_SPRAY | CUTANEOUS | Status: DC | PRN

## 2019-04-24 MED ORDER — OXYCODONE-ACETAMINOPHEN 5-325 MG PO TABS: 1.0000 | ORAL_TABLET | ORAL | Status: DC | PRN

## 2019-04-24 MED ORDER — OXYTOCIN 40 UNITS IN NORMAL SALINE INFUSION - SIMPLE MED
1.0000 m[IU]/min | INTRAVENOUS | Status: DC
  Administered 2019-04-24: 4 m[IU]/min via INTRAVENOUS

## 2019-04-24 MED ORDER — TETANUS-DIPHTH-ACELL PERTUSSIS 5-2.5-18.5 LF-MCG/0.5 IM SUSP: 0.5000 mL | Freq: Once | INTRAMUSCULAR | Status: DC

## 2019-04-24 MED ORDER — DIPHENHYDRAMINE HCL 25 MG PO CAPS: 25.0000 mg | ORAL_CAPSULE | Freq: Four times a day (QID) | ORAL | Status: DC | PRN

## 2019-04-24 MED ORDER — IBUPROFEN 600 MG PO TABS
600.0000 mg | ORAL_TABLET | Freq: Four times a day (QID) | ORAL | Status: DC
  Filled 2019-04-24 (×2): qty 1

## 2019-04-24 MED ORDER — OXYTOCIN 40 UNITS IN NORMAL SALINE INFUSION - SIMPLE MED
2.5000 [IU]/h | INTRAVENOUS | Status: DC | PRN
  Filled 2019-04-24: qty 1000

## 2019-04-24 MED ORDER — SIMETHICONE 80 MG PO CHEW: 80.0000 mg | CHEWABLE_TABLET | ORAL | Status: DC | PRN

## 2019-04-24 MED ORDER — PRENATAL MULTIVITAMIN CH
1.0000 | ORAL_TABLET | Freq: Every day | ORAL | Status: DC
  Administered 2019-04-24 – 2019-04-25 (×2): 1 via ORAL
  Filled 2019-04-24 (×2): qty 1

## 2019-04-24 NOTE — Progress Notes (Signed)
Danielle Chen is a 33 y.o. G2P1001 at [redacted]w[redacted]d  admitted for rupture of membranes. Last night her contractions decreased in number and intensity and rather than be augmented patient chose to sleep through the night and have augmentation performed in the morning.  She was able to get a few hours sleep and Pitocin was begun this morning.  Subjective: Patient is handling labor without pain medication at this time and doing well.  Objective: BP (!) 126/91   Pulse 86   Temp 97.8 F (36.6 C) (Oral)   Resp 18   Ht 5\' 4"  (1.626 m)   Wt 71.7 kg   LMP 07/12/2018   BMI 27.12 kg/m  I/O last 3 completed shifts: In: 1780.6 [P.O.:240; I.V.:1440.6; IV Piggyback:100] Out: -  Total I/O In: 575.1 [I.V.:483.4; IV Piggyback:91.6] Out: -     UC:   regular, every 3 minutes SVE:   Dilation: 5 Effacement (%): 60 Station: -2 Exam by:: Danielle Severin, RN  Labs: Lab Results  Component Value Date   WBC 13.6 (H) 04/23/2019   HGB 12.8 04/23/2019   HCT 37.7 04/23/2019   MCV 95.4 04/23/2019   PLT 171 04/23/2019    Assessment / Plan: Augmentation of labor, progressing well Expect vaginal delivery this morning.  Danielle Chen 04/24/2019, 7:52 AM

## 2019-04-24 NOTE — Progress Notes (Signed)
Intrapartum Progress Note  Recieved care from Dr. Amalia Hailey this morning. Patient was admitted overnight for latent labor and SROM at 5 pm yesterday.   S: Patient feeling contractions, 8/10. Declines pain medications or epidural at this time.   O: Blood pressure (!) 126/91, pulse 86, temperature 97.8 F (36.6 C), temperature source Oral, resp. rate 18, height 5\' 4"  (1.626 m), weight 71.7 kg, last menstrual period 07/12/2018. Gen App: NAD, mild to moderate discomfort with contractions. Abdomen: soft, gravid FHT: baseline 125 bpm.  Accels present.  Decels present - occasional variable deceleration. moderate in degree variability.   Tocometer: contractions q 2-3 minutes Cervix: 4.5-5/60/-2/bulging bag of water Extremities: Nontender, no edema.  Pitocin: 20 mIU of pitocin  Labs:  Results for orders placed or performed during the hospital encounter of 04/23/19  SARS Coronavirus 2 (CEPHEID - Performed in [place] hospital lab), [facility] Order   Specimen: Nasopharyngeal Swab  Result Value Ref Range   SARS Coronavirus 2 NEGATIVE NEGATIVE  ROM Plus ([facility] only)  Result Value Ref Range   Rom Plus POSITIVE   CBC  Result Value Ref Range   WBC 13.6 (H) 4.0 - 10.5 K/uL   RBC 3.95 3.87 - 5.11 MIL/uL   Hemoglobin 12.8 12.0 - 15.0 g/dL   HCT 37.7 36.0 - 46.0 %   MCV 95.4 80.0 - 100.0 fL   MCH 32.4 26.0 - 34.0 pg   MCHC 34.0 30.0 - 36.0 g/dL   RDW 12.4 11.5 - 15.5 %   Platelets 171 150 - 400 K/uL   nRBC 0.0 0.0 - 0.2 %  Type and screen [place]  Result Value Ref Range   ABO/RH(D) O POS    Antibody Screen NEG    Sample Expiration      04/26/2019,2359 Performed at [facility], [address] [place], [place] [address]       Assessment:  1: SIUP at [redacted]w[redacted]d 2. SROM 3. GBS+  Plan:  1. Patient with large forebag on exam, has made minimal progress overnight. Forebag ruptured.  2. Continue Pitocin augmentation 3. Continue ampicillin dosing for GBS positive  status.  Has received 3 doses of ampicillin, adequate treatment.  4. Anticipate vaginal delivery   Rubie Maid, MD 04/24/2019 8:09 AM

## 2019-04-24 NOTE — H&P (Signed)
History and Physical   HPI  Danielle Chen is a 33 y.o. G2P1001 at [redacted]w[redacted]d Estimated Date of Delivery: 04/28/19 who is being admitted for early labor with spontaneous rupture of membranes Patient states she is not interested in pain medication and would like to do labor as "natural as possible."   OB History  OB History  Gravida Para Term Preterm AB Living  2 1 1  0 0 1  SAB TAB Ectopic Multiple Live Births  0 0 0 0 1    # Outcome Date GA Lbr Len/2nd Weight Sex Delivery Anes PTL Lv  2 Current           1 Term 08/30/17    M Vag-Spont   LIV    PROBLEM LIST  Pregnancy complications or risks: Patient Active Problem List   Diagnosis Date Noted  . Normal labor 04/23/2019  . Transverse presentation, antepartum 04/15/2019  . Group B streptococcal carriage complicating pregnancy 97/35/3299  . Gestational thrombocytopenia, third trimester ([place]) 03/12/2019  . Supervision of normal pregnancy 03/12/2019  . History of gestational diabetes in prior pregnancy, currently pregnant 03/12/2019     Prenatal labs and studies: ABO, Rh: --/--/O POS (07/14 2101) Antibody: NEG (07/14 2101) Rubella: 4.29 (01/03 0955) RPR: Non Reactive (05/12 0933)  HBsAg: Negative (01/03 0955)  HIV: Non Reactive (01/03 0955)  MEQ:ASTMHDQQ (06/24 2297)   Past Medical History:  Diagnosis Date  . Gestational diabetes      History reviewed. No pertinent surgical history.   Medications    Current Discharge Medication List    CONTINUE these medications which have NOT CHANGED   Details  Prenatal Vit-Fe Fumarate-FA (PRENATAL MULTIVITAMIN) TABS tablet Take 1 tablet by mouth daily at 12 noon.   Associated Diagnoses: Encounter for supervision of other normal pregnancy in first trimester         Allergies  Patient has no known allergies.  Review of Systems  Pertinent items are noted in HPI.  Physical Exam  BP (!) 126/91   Pulse 86   Temp 97.8 F (36.6 C) (Oral)   Resp 18   Ht 5\' 4"   (1.626 m)   Wt 71.7 kg   LMP 07/12/2018   BMI 27.12 kg/m   Lungs:  CTA B Cardio: RRR without M/R/G Abd: Soft, gravid, NT Presentation: cephalic EXT: No C/C/ 1+ Edema DTRs: 2+ B CERVIX: Dilation: 5 Effacement (%): 60 Cervical Position: Middle Station: -2 Presentation: Vertex Exam by:: Collene Leyden, RN   See Prenatal records for more detailed PE.     FHR:  Variability: Good {> 6 bpm)  Toco: Uterine Contractions: Upon admission patient was having regular contractions.   Test Results  Results for orders placed or performed during the hospital encounter of 04/23/19 (from the past 24 hour(s))  ROM Plus ([place] only)     Status: None   Collection Time: 04/23/19  7:59 PM  Result Value Ref Range   Rom Plus POSITIVE   SARS Coronavirus 2 (CEPHEID - Performed in [place] hospital lab), Hosp Order     Status: None   Collection Time: 04/23/19  8:04 PM   Specimen: Nasopharyngeal Swab  Result Value Ref Range   SARS Coronavirus 2 NEGATIVE NEGATIVE  CBC     Status: Abnormal   Collection Time: 04/23/19  9:01 PM  Result Value Ref Range   WBC 13.6 (H) 4.0 - 10.5 K/uL   RBC 3.95 3.87 - 5.11 MIL/uL   Hemoglobin 12.8 12.0 - 15.0 g/dL  HCT 37.7 36.0 - 46.0 %   MCV 95.4 80.0 - 100.0 fL   MCH 32.4 26.0 - 34.0 pg   MCHC 34.0 30.0 - 36.0 g/dL   RDW 16.112.4 09.611.5 - 04.515.5 %   Platelets 171 150 - 400 K/uL   nRBC 0.0 0.0 - 0.2 %  Type and screen [facility]     Status: None   Collection Time: 04/23/19  9:01 PM  Result Value Ref Range   ABO/RH(D) O POS    Antibody Screen NEG    Sample Expiration      04/26/2019,2359 Performed at [facility], [address] [place], [place] [address]    Group B Strep positive  Assessment   G2P1001 at 81108w3d Estimated Date of Delivery: 04/28/19  The fetus is reassuring.  Patient in early labor with positive ROM plus.  Patient Active Problem List   Diagnosis Date Noted  . Normal labor 04/23/2019  . Transverse  presentation, antepartum 04/15/2019  . Group B streptococcal carriage complicating pregnancy 04/10/2019  . Gestational thrombocytopenia, third trimester ([facility]) 03/12/2019  . Supervision of normal pregnancy 03/12/2019  . History of gestational diabetes in prior pregnancy, currently pregnant 03/12/2019    Plan  1. Admit to L&D :    2. EFM: -- Category 1 3. Epidural if desired.  Stadol for IV pain until epidural requested. 4. Admission labs  5.  GBS prophylaxis  Elonda Huskyavid J. , M.D. 04/24/2019 7:48 AM

## 2019-04-25 LAB — RPR: RPR Ser Ql: NONREACTIVE

## 2019-04-25 MED ORDER — COCONUT OIL OIL: 1.0000 "application " | TOPICAL_OIL | Status: DC | PRN

## 2019-04-25 NOTE — Discharge Instructions (Signed)
Please call your doctor or return to the ER if you experience any chest pains, shortness of breath, dizziness, visual changes, fever greater than 101, any heavy bleeding (saturating more than 1 pad per hour), large clots, or foul smelling discharge, any worsening abdominal pain and cramping that is not controlled by pain medication,  Any breast concerns (pain/redness), or any signs of postpartum depression. No tampons, enemas, douches, or sexual intercourse for 6 weeks. Also avoid tub baths, hot tubs, or swimming for 6 weeks.

## 2019-04-25 NOTE — Progress Notes (Signed)
Discharge order received from doctor. Reviewed discharge instructions and prescriptions with patient and answered all questions. Follow up appointment instructions given. Patient verbalized understanding. ID bands checked. Patient discharged home with infant via wheelchair by nursing/auxillary.    Rc Amison Garner, RN  

## 2019-04-25 NOTE — Discharge Summary (Signed)
                              Discharge Summary  Date of Admission: 04/23/2019  Date of Discharge: 04/25/2019  Admitting Diagnosis: Onset of Labor at [redacted]w[redacted]d and ROM  Mode of Delivery: normal spontaneous vaginal delivery                 Discharge Diagnosis: No other diagnosis   Intrapartum Procedures: GBS prophylaxis, laceration 1st and pitocin augmentation   Post partum procedures:   Complications: none                      Discharge Day SOAP Note:  Progress Note - Vaginal Delivery  Danielle Chen is a 33 y.o. G2P2002 now PP day 1 s/p Vaginal, Spontaneous . Delivery was uncomplicated  Subjective  The patient has the following complaints: has no unusual complaints  Pain is controlled with current medications.   Patient is urinating without difficulty.  She is ambulating well.     Objective  Vital signs: BP 111/84 (BP Location: Left Arm)   Pulse 71   Temp 98 F (36.7 C) (Oral)   Resp 18   Ht 5\' 4"  (1.626 m)   Wt 71.7 kg   LMP 07/12/2018   SpO2 99%   Breastfeeding Unknown   BMI 27.12 kg/m   Physical Exam: Gen: NAD Fundus Fundal Tone: Firm  Lochia Amount: Small  Perineum Appearance: Approximated     Data Review Labs: CBC Latest Ref Rng & Units 04/23/2019 04/10/2019 04/03/2019  WBC 4.0 - 10.5 K/uL 13.6(H) - 8.6  Hemoglobin 12.0 - 15.0 g/dL 12.8 - 12.5  Hematocrit 36.0 - 46.0 % 37.7 - 35.7  Platelets 150 - 400 K/uL 171 154 -   O POS  Assessment/Plan  Active Problems:   Group B streptococcal carriage complicating pregnancy   Normal labor    Plan for discharge today.   Discharge Instructions: Per After Visit Summary. Activity: Advance as tolerated. Pelvic rest for 6 weeks.  Also refer to After Visit Summary Diet: Regular Medications: Allergies as of 04/25/2019   No Known Allergies     Medication List    TAKE these medications   prenatal multivitamin Tabs tablet Take 1 tablet by mouth daily at 12 noon.      Outpatient follow up:   Follow-up Information    Harlin Heys, MD. Schedule an appointment as soon as possible for a visit in 6 week(s).   Specialties: Obstetrics and Gynecology, Radiology Why: Please call to schedule your 6 week postpartum follow up appointment with Dr. Ty Hilts information: [address] [place] [address] [phone]          Postpartum contraception: Will discuss at first office visit post-partum  Discharged Condition: good  Discharged to: home  Newborn Data: Disposition:home with mother  Apgars: APGAR (1 MIN): 8   APGAR (5 MINS): 9   APGAR (10 MINS):    Baby Feeding: Breast    Finis Bud, M.D. 04/25/2019 1:13 PM

## 2019-04-26 LAB — PLATELET COUNT: Platelets: 160 10*3/uL (ref 150–450)

## 2019-04-26 LAB — SPECIMEN STATUS REPORT

## 2019-04-29 ENCOUNTER — Ambulatory Visit: Admit: 2019-04-29 | Payer: Commercial Managed Care - PPO

## 2019-04-30 ENCOUNTER — Encounter: Payer: Commercial Managed Care - PPO | Admitting: Obstetrics and Gynecology

## 2019-06-05 ENCOUNTER — Other Ambulatory Visit: Payer: Self-pay

## 2019-06-05 ENCOUNTER — Encounter: Payer: Self-pay | Admitting: Obstetrics and Gynecology

## 2019-06-05 ENCOUNTER — Ambulatory Visit (INDEPENDENT_AMBULATORY_CARE_PROVIDER_SITE_OTHER): Payer: Commercial Managed Care - PPO | Admitting: Obstetrics and Gynecology

## 2019-06-05 DIAGNOSIS — Z30011 Encounter for initial prescription of contraceptive pills: Secondary | ICD-10-CM

## 2019-06-05 MED ORDER — LEVONORGEST-ETH ESTRAD 91-DAY 0.15-0.03 &0.01 MG PO TABS: 1.0000 | ORAL_TABLET | Freq: Every day | ORAL | 1 refills | Status: DC

## 2019-06-05 NOTE — Progress Notes (Signed)
HPI:      [name] is a 33 y.o. [id number] who LMP was No LMP recorded. (Menstrual status: Lactating).  Subjective:   She presents today 6 weeks postpartum.  She is breast-feeding full-time.  Her husband plans to get a vasectomy in the near future but she would like to start OCPs prior to his vasectomy. States that once or twice a week for a few hours she feels "down" but she says this resolves quickly.  She is getting 4-hour stretches at night between breast-feeding.    Hx: The following portions of the patient's history were reviewed and updated as appropriate:             She  has a past medical history of Gestational diabetes. She does not have any pertinent problems on file. She  has no past surgical history on file. Her family history includes Healthy in her maternal grandfather, maternal grandmother, mother, and sister; Hypertension in her father. She  reports that she has never smoked. She has never used smokeless tobacco. She reports previous alcohol use. She reports that she does not use drugs. She has a current medication list which includes the following prescription(s): prenatal multivitamin and levonorgestrel-ethinyl estradiol. She has No Known Allergies.       Review of Systems:  Review of Systems  Constitutional: Denied constitutional symptoms, night sweats, recent illness, fatigue, fever, insomnia and weight loss.  Eyes: Denied eye symptoms, eye pain, photophobia, vision change and visual disturbance.  Ears/Nose/Throat/Neck: Denied ear, nose, throat or neck symptoms, hearing loss, nasal discharge, sinus congestion and sore throat.  Cardiovascular: Denied cardiovascular symptoms, arrhythmia, chest pain/pressure, edema, exercise intolerance, orthopnea and palpitations.  Respiratory: Denied pulmonary symptoms, asthma, pleuritic pain, productive sputum, cough, dyspnea and wheezing.  Gastrointestinal: Denied, gastro-esophageal reflux, melena, nausea and vomiting.   Genitourinary: Denied genitourinary symptoms including symptomatic vaginal discharge, pelvic relaxation issues, and urinary complaints.  Musculoskeletal: Denied musculoskeletal symptoms, stiffness, swelling, muscle weakness and myalgia.  Dermatologic: Denied dermatology symptoms, rash and scar.  Neurologic: Denied neurology symptoms, dizziness, headache, neck pain and syncope.  Psychiatric: Denied psychiatric symptoms, anxiety and depression.  Endocrine: Denied endocrine symptoms including hot flashes and night sweats.   Meds:   Current Outpatient Medications on File Prior to Visit  Medication Sig Dispense Refill  . Prenatal Vit-Fe Fumarate-FA (PRENATAL MULTIVITAMIN) TABS tablet Take 1 tablet by mouth daily at 12 noon.     No current facility-administered medications on file prior to visit.     Objective:     Vitals:   06/05/19 0844  BP: 138/88  Pulse: 65              Pelvic examination   Pelvic:   Vulva: Normal appearance.  No lesions.  No abnormal scarring.    Vagina: No lesions or abnormalities noted.  Moderate vaginal atrophy noted  Support: Normal pelvic support.  Urethra No masses tenderness or scarring.  Meatus Normal size without lesions or prolapse.  Cervix: Normal ectropion.  No lesions.  Anus: Normal exam.  No lesions.  Perineum: Normal exam.  No lesions.  Healed well.          Bimanual   Uterus: Normal size.  Non-tender.  Mobile.  AV.  Adnexae: No masses.  Non-tender to palpation.  Cul-de-sac: Negative for abnormality.     Assessment:    [id number] Patient Active Problem List   Diagnosis Date Noted  . Normal labor 04/23/2019  . Group B streptococcal carriage complicating pregnancy 04/10/2019  .  Gestational thrombocytopenia, third trimester ([place]) 03/12/2019  . Supervision of normal pregnancy 03/12/2019  . History of gestational diabetes in prior pregnancy, currently pregnant 03/12/2019     1. Postpartum care and examination immediately after delivery    2. 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  Patient may resume normal activities with exception of heavy lifting  2.  We have discussed postpartum depression in detail and I do not believe that she will have a significant issue with this she seems to be handling her short episodes of mood changes very well.  She has been instructed to contact us immediately if this begins to get worse.  3. Birth Control I discussed multiple birth control options and methods with the patient.  The risks and benefits of each were reviewed. Breastfeeding and Birth Control Breastfeeding and birth control were discussed in detail.  The patient understands that breastfeeding is not a reliable form of birth control.  We have discussed the use of Progesterone-only birth control pills and combination OCPs.  I have informed her that Progesterone only pills are safe with breastfeeding and very effective when used in combination with full-time breastfeeding.  I have stressed that when she begins to wean from breastfeeding, Progesterone-only pills become less effective and switching to another form of birth control is recommended.  We have also discussed the use of combination OCPs with breastfeeding.  I have informed her that OCPs are safe with breastfeeding even though a small amount of the drug is carried in breast milk.  We have discussed the fact that combination OCPs can reduce the amount of breast milk produced.  I have made her aware that in rare instances this can lead to discontinuation of breastfeeding.  The use of Mirena IUD was discussed and she has been made aware that this is safe with breastfeeding.  All her questions were answered and she was given appropriate literature on birth control methods. Patient has elected to begin combination OCPs Orders No orders of the defined types were placed in this encounter.    Meds ordered this encounter  Medications   . Levonorgestrel-Ethinyl Estradiol (AMETHIA) 0.15-0.03 &0.01 MG tablet    Sig: Take 1 tablet by mouth at bedtime.    Dispense:  84 tablet    Refill:  1      F/U  Return in about 3 months (around 09/05/2019) for Annual Physical.  Finis Bud, M.D. 06/05/2019 9:17 AM

## 2019-06-05 NOTE — Progress Notes (Signed)
Patient comes in today for 6 week PPV. She is still having a pinkish discharge. She is nursing the baby. Would like to try [facility] pill.

## 2019-09-10 ENCOUNTER — Encounter: Payer: Commercial Managed Care - PPO | Admitting: Obstetrics and Gynecology

## 2019-09-24 ENCOUNTER — Other Ambulatory Visit: Payer: Self-pay

## 2019-09-24 ENCOUNTER — Ambulatory Visit (INDEPENDENT_AMBULATORY_CARE_PROVIDER_SITE_OTHER): Payer: Commercial Managed Care - PPO | Admitting: Obstetrics and Gynecology

## 2019-09-24 ENCOUNTER — Encounter: Payer: Self-pay | Admitting: Obstetrics and Gynecology

## 2019-09-24 VITALS — BP 128/86 | HR 74 | Ht 64.0 in | Wt 140.2 lb

## 2019-09-24 DIAGNOSIS — Z13220 Encounter for screening for lipoid disorders: Secondary | ICD-10-CM

## 2019-09-24 DIAGNOSIS — Z30011 Encounter for initial prescription of contraceptive pills: Secondary | ICD-10-CM

## 2019-09-24 DIAGNOSIS — Z01419 Encounter for gynecological examination (general) (routine) without abnormal findings: Secondary | ICD-10-CM | POA: Diagnosis not present

## 2019-09-24 MED ORDER — LO LOESTRIN FE 1 MG-10 MCG / 10 MCG PO TABS: 1.0000 | ORAL_TABLET | Freq: Every day | ORAL | 3 refills | Status: DC

## 2019-09-24 NOTE — Progress Notes (Signed)
HPI:      [name] is a 33 y.o. [id number] who LMP was Patient's last menstrual period was 09/10/2019.  Subjective:   She presents today for her annual examination.  She did not like Seasonique as she had breakthrough bleeding in the first pack and did not want to continue.  She continues to breast and bottle feed her 38-month-old baby.  She is not currently using anything for birth control.  She would like to take typical monthly OCPs.    Hx: The following portions of the patient's history were reviewed and updated as appropriate:             She  has a past medical history of Gestational diabetes. She does not have any pertinent problems on file. She  has no past surgical history on file. Her family history includes Healthy in her maternal grandfather, maternal grandmother, mother, and sister; Hypertension in her father. She  reports that she has never smoked. She has never used smokeless tobacco. She reports previous alcohol use. She reports that she does not use drugs. She has a current medication list which includes the following prescription(s): prenatal multivitamin, levonorgestrel-ethinyl estradiol, and lo loestrin fe. She has No Known Allergies.       Review of Systems:  Review of Systems  Constitutional: Denied constitutional symptoms, night sweats, recent illness, fatigue, fever, insomnia and weight loss.  Eyes: Denied eye symptoms, eye pain, photophobia, vision change and visual disturbance.  Ears/Nose/Throat/Neck: Denied ear, nose, throat or neck symptoms, hearing loss, nasal discharge, sinus congestion and sore throat.  Cardiovascular: Denied cardiovascular symptoms, arrhythmia, chest pain/pressure, edema, exercise intolerance, orthopnea and palpitations.  Respiratory: Denied pulmonary symptoms, asthma, pleuritic pain, productive sputum, cough, dyspnea and wheezing.  Gastrointestinal: Denied, gastro-esophageal reflux, melena, nausea and vomiting.  Genitourinary:  Denied genitourinary symptoms including symptomatic vaginal discharge, pelvic relaxation issues, and urinary complaints.  Musculoskeletal: Denied musculoskeletal symptoms, stiffness, swelling, muscle weakness and myalgia.  Dermatologic: Denied dermatology symptoms, rash and scar.  Neurologic: Denied neurology symptoms, dizziness, headache, neck pain and syncope.  Psychiatric: Denied psychiatric symptoms, anxiety and depression.  Endocrine: Denied endocrine symptoms including hot flashes and night sweats.   Meds:   Current Outpatient Medications on File Prior to Visit  Medication Sig Dispense Refill  . Prenatal Vit-Fe Fumarate-FA (PRENATAL MULTIVITAMIN) TABS tablet Take 1 tablet by mouth daily at 12 noon.    . Levonorgestrel-Ethinyl Estradiol (AMETHIA) 0.15-0.03 &0.01 MG tablet Take 1 tablet by mouth at bedtime. (Patient not taking: Reported on 09/24/2019) 84 tablet 1   No current facility-administered medications on file prior to visit.    Objective:     Vitals:   09/24/19 1444  BP: 128/86  Pulse: 74              Physical examination General NAD, Conversant  HEENT Atraumatic; Op clear with mmm.  Normo-cephalic. Pupils reactive. Anicteric sclerae  Thyroid/Neck Smooth without nodularity or enlargement. Normal ROM.  Neck Supple.  Skin No rashes, lesions or ulceration. Normal palpated skin turgor. No nodularity.  Breasts: No masses or discharge.  Symmetric.  No axillary adenopathy.  Lungs: Clear to auscultation.No rales or wheezes. Normal Respiratory effort, no retractions.  Heart: NSR.  No murmurs or rubs appreciated. No periferal edema  Abdomen: Soft.  Non-tender.  No masses.  No HSM. No hernia  Extremities: Moves all appropriately.  Normal ROM for age. No lymphadenopathy.  Neuro: Oriented to PPT.  Normal mood. Normal affect.     Pelvic:  Vulva: Normal appearance.  No lesions.  Vagina: No lesions or abnormalities noted.  Support: Normal pelvic support.  Urethra No masses  tenderness or scarring.  Meatus Normal size without lesions or prolapse.  Cervix: Normal appearance.  No lesions.  Anus: Normal exam.  No lesions.  Perineum: Normal exam.  No lesions.        Bimanual   Uterus: Normal size.  Non-tender.  Mobile.  AV.  Adnexae: No masses.  Non-tender to palpation.  Cul-de-sac: Negative for abnormality.      Assessment:    [id number] Patient Active Problem List   Diagnosis Date Noted  . Normal labor 04/23/2019  . Group B streptococcal carriage complicating pregnancy 38/46/6599  . Gestational thrombocytopenia, third trimester ([place]) 03/12/2019  . Supervision of normal pregnancy 03/12/2019  . History of gestational diabetes in prior pregnancy, currently pregnant 03/12/2019     1. Lipid screening   2. Well woman exam with routine gynecological exam   3. Initiation of OCP (BCP)     Patient did not like Seasonique-will begin monthly OCPs   Plan:            1.  Basic Screening Recommendations The basic screening recommendations for asymptomatic women were discussed with the patient during her visit.  The age-appropriate recommendations were discussed with her and the rational for the tests reviewed.  When I am informed by the patient that another primary care physician has previously obtained the age-appropriate tests and they are up-to-date, only outstanding tests are ordered and referrals given as necessary.  Abnormal results of tests will be discussed with her when all of her results are completed.  Routine preventative health maintenance measures emphasized: Exercise/Diet/Weight control, Tobacco Warnings, Alcohol/Substance use risks and Stress Management 2.  Patient instructed in the use of OCPs.  First day first day start discussed.  Breast-feeding and OCPs again discussed all questions answered. Orders Orders Placed This Encounter  Procedures  . Lipid Profile  . TSH  . HgB A1c     Meds ordered this encounter  Medications  .  Norethindrone-Ethinyl Estradiol-Fe [place]has (LO LOESTRIN FE) 1 MG-10 MCG / 10 MCG tablet    Sig: Take 1 tablet by mouth at bedtime for 28 days.    Dispense:  3 Package    Refill:  3        F/U  No follow-ups on file.  Finis Bud, M.D. 09/24/2019 3:20 PM

## 2019-10-28 ENCOUNTER — Other Ambulatory Visit: Payer: Self-pay | Admitting: Surgical

## 2019-10-28 MED ORDER — DESOGESTREL-ETHINYL ESTRADIOL 0.15-0.02/0.01 MG (21/5) PO TABS: 1.0000 | ORAL_TABLET | Freq: Every day | ORAL | 2 refills | Status: DC

## 2020-01-21 ENCOUNTER — Other Ambulatory Visit: Payer: Self-pay | Admitting: Obstetrics and Gynecology

## 2020-03-23 IMAGING — US OBSTETRIC 14+ WK ULTRASOUND
2 series · 13 of 28 positions shown · non-contrast
Comparison: none

CLINICAL DATA: Gestational age by LMP of 21 weeks 1 day. History of
gestational diabetes. Evaluate dating and fetal anatomy.

EXAM:
OBSTETRICAL ULTRASOUND >14 WKS

[Series 1: obstetric 14+ wk ultrasound · 12 of 77 slices shown (1 of 2)]
[im 4/77]
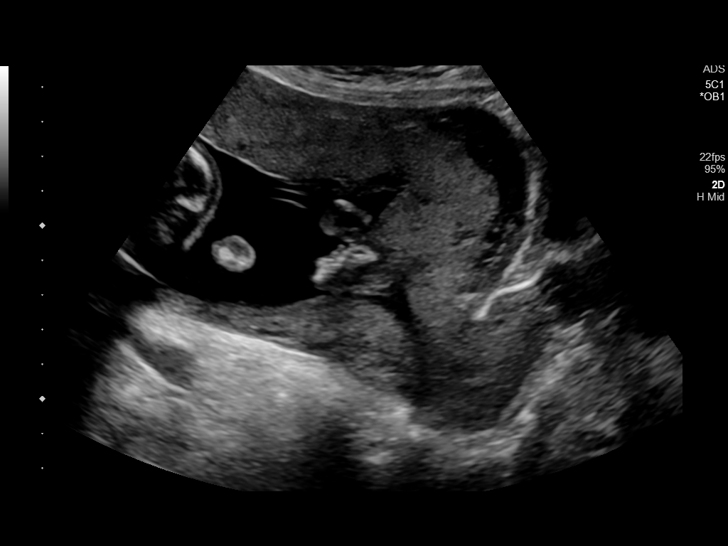
[im 10/77]
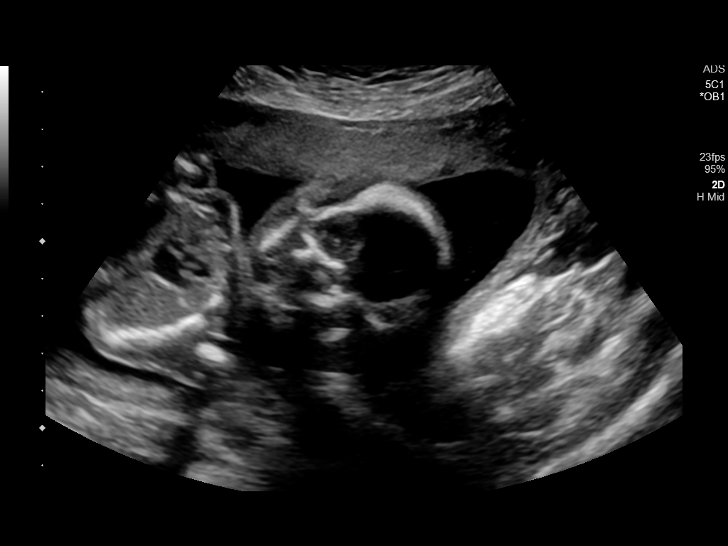
[im 16/77]
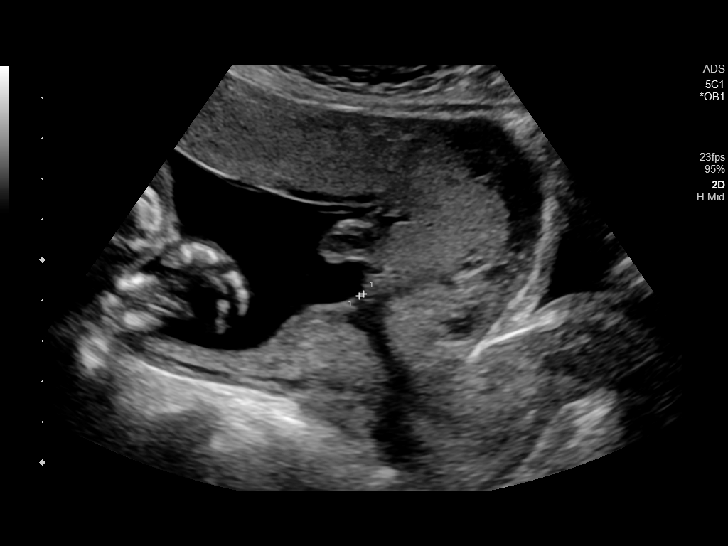
[im 22/77]
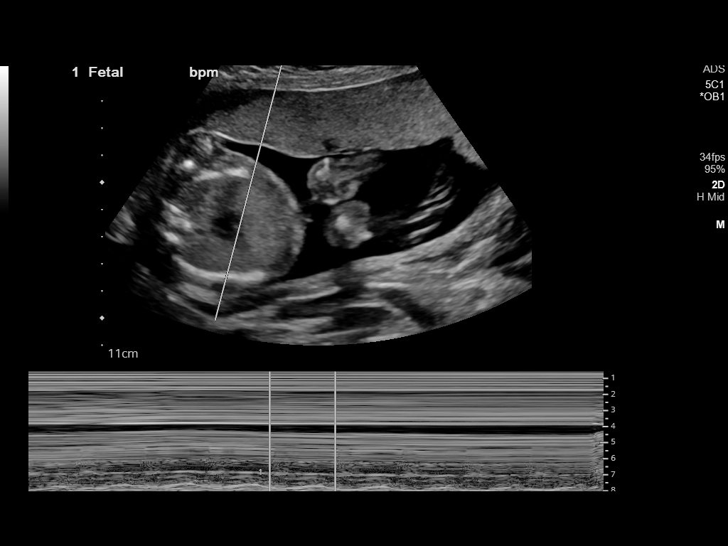
[im 28/77]
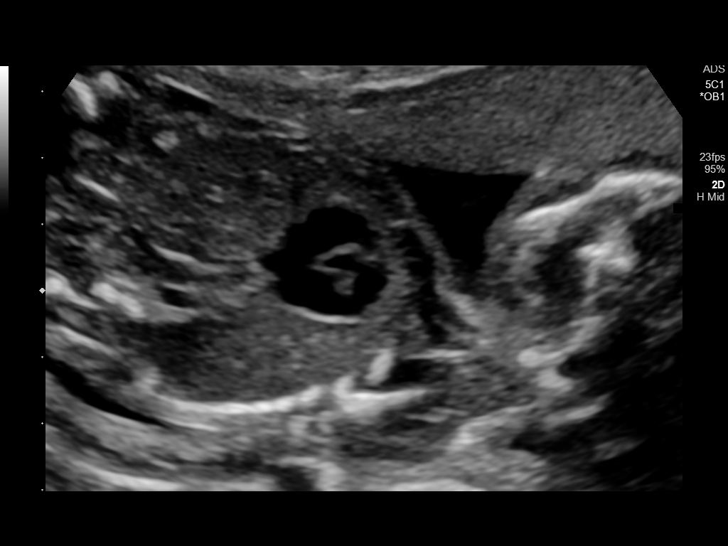
[im 34/77]
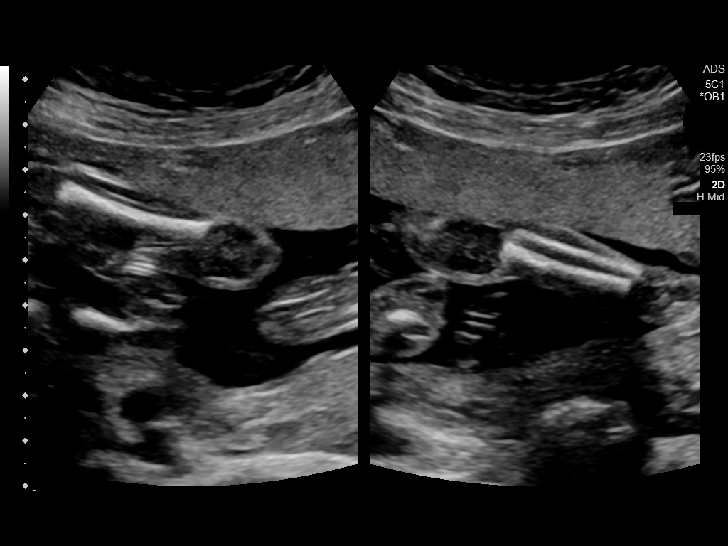
[im 43/77]
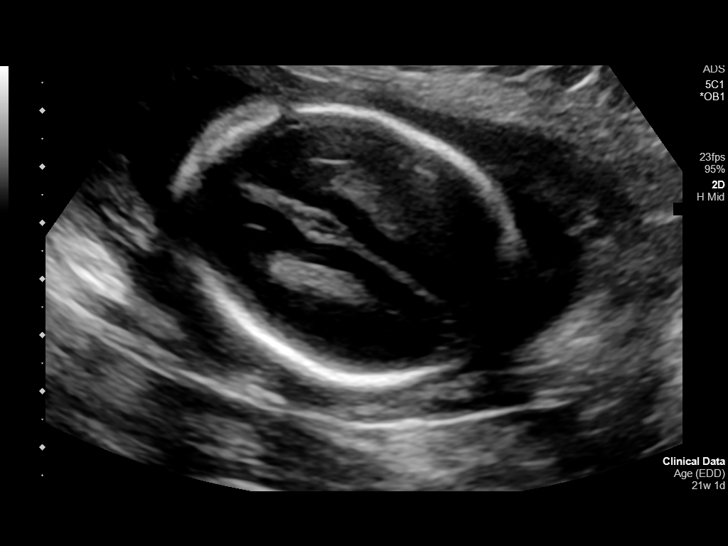
[im 49/77]
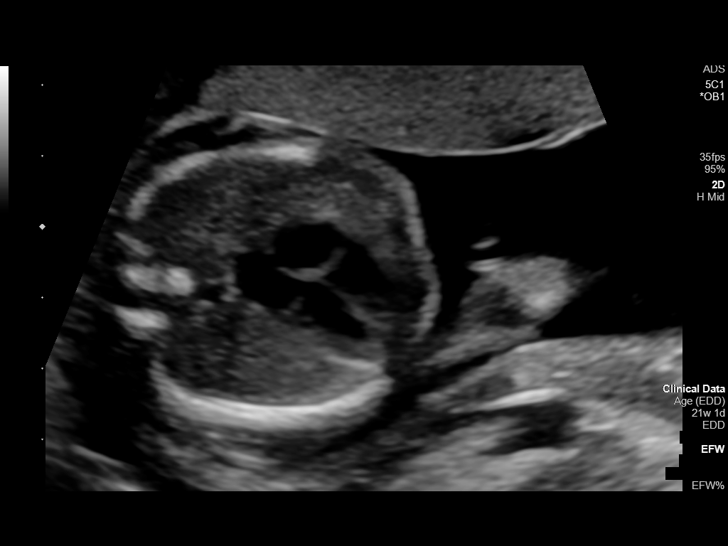
[im 55/77]
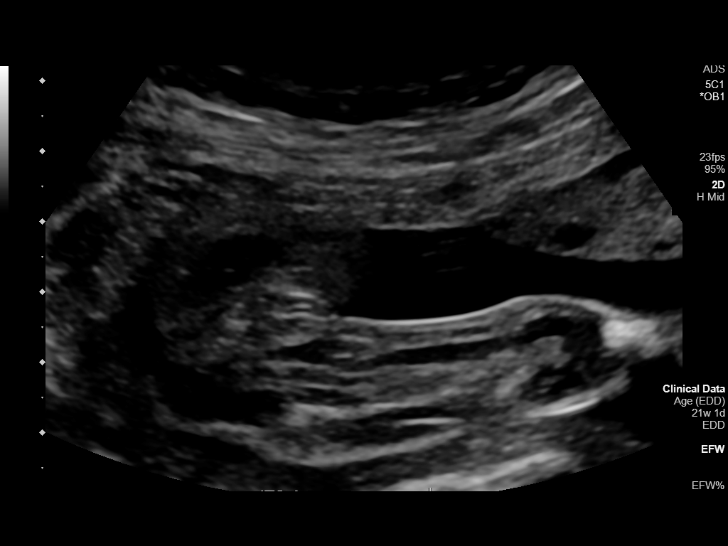
[im 61/77]
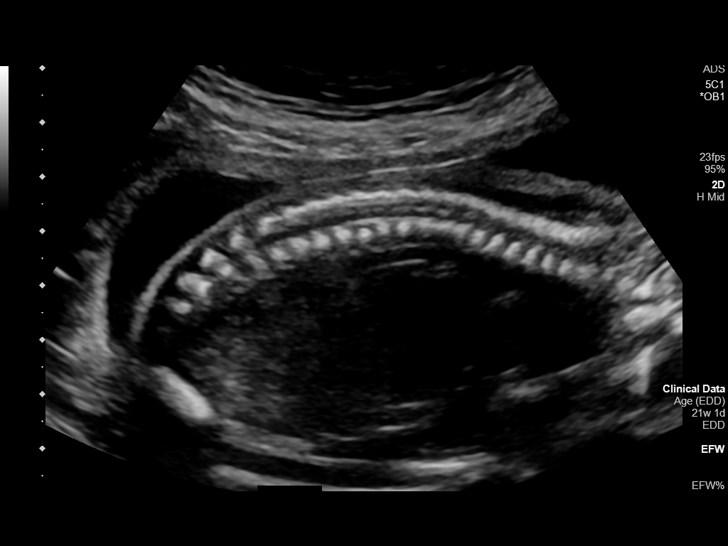
[im 67/77]
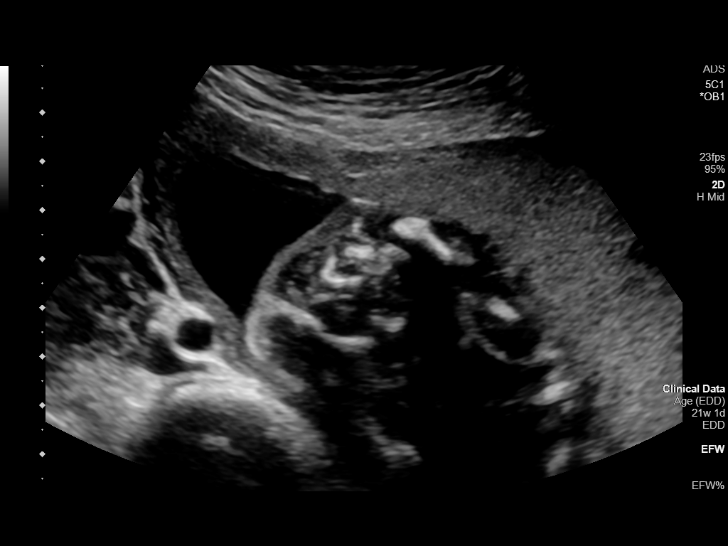
[im 73/77]
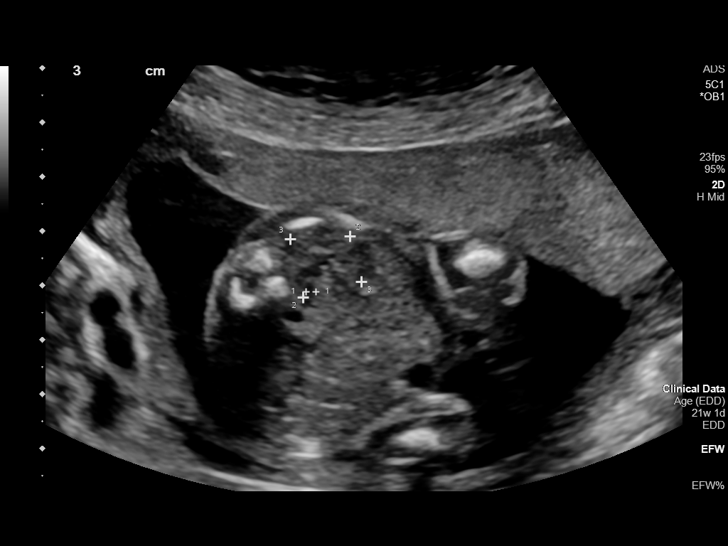

[Series 1001: obstetric 14+ wk ultrasound · 1 of 7 slices shown (2 of 2)]
[im 1/7]
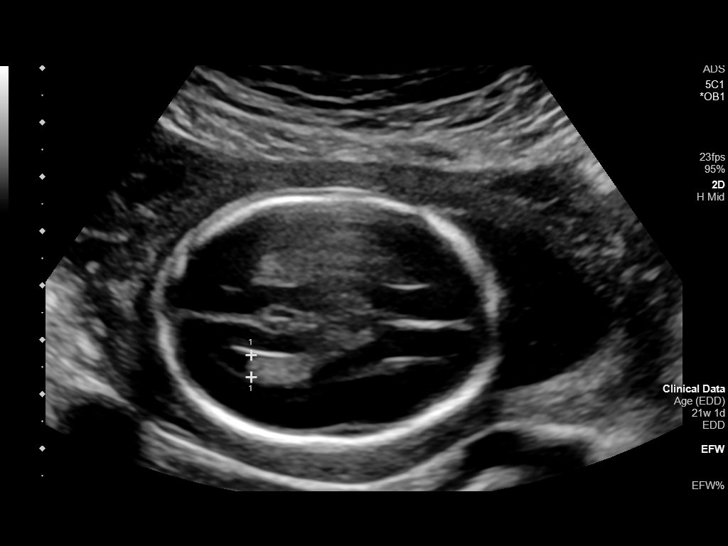

[13 of 28 positions shown; findings below may reference images not displayed]

FINDINGS: Number of Fetuses: 1

Heart Rate:  157 bpm

Movement: Yes

Presentation: Variable

Previa: No

Placental Location: Anterior

Amniotic Fluid (Subjective): Within normal limits

Amniotic Fluid (Objective):

Vertical pocket = 3.1cm

FETAL BIOMETRY

BPD: 4.7cm 20w 1d

HC:   18.4cm 20w 6d

AC:   16.9cm 22w 0d

FL:   3.4cm 20w 6d

Current Mean GA: 21w 0d US EDC: 04/29/2019

FETAL ANATOMY

Lateral Ventricles: Appears normal

Thalami/CSP: Appears normal

Posterior Fossa:  Appears normal

Nuchal Region: Appears normal   NFT= N/A > 20 WKS

Upper Lip: Appears normal

Spine: Appears normal

4 Chamber Heart on Left: Appears normal

LVOT: Appears normal

RVOT: Appears normal

Stomach on Left: Appears normal

3 Vessel Cord: Appears normal

Cord Insertion site: Appears normal

Kidneys: Appears normal

Bladder: Appears normal

Extremities: Appears normal

Maternal Findings:

Cervix:  4.1 cm TA
IMPRESSION: Single living IUP measuring 21 week 0 days, with US EDC of
04/29/2019. This is concordant with LMP.

Normal visualized fetal anatomy.  No anomalies identified.

## 2020-08-09 ENCOUNTER — Other Ambulatory Visit: Payer: Self-pay | Admitting: Obstetrics and Gynecology

## 2020-10-13 ENCOUNTER — Ambulatory Visit (INDEPENDENT_AMBULATORY_CARE_PROVIDER_SITE_OTHER): Payer: Commercial Managed Care - PPO | Admitting: Obstetrics and Gynecology

## 2020-10-13 ENCOUNTER — Other Ambulatory Visit: Payer: Self-pay

## 2020-10-13 ENCOUNTER — Encounter: Payer: Self-pay | Admitting: Obstetrics and Gynecology

## 2020-10-13 VITALS — BP 127/86 | HR 73 | Ht 64.0 in | Wt 136.7 lb

## 2020-10-13 DIAGNOSIS — Z30011 Encounter for initial prescription of contraceptive pills: Secondary | ICD-10-CM | POA: Diagnosis not present

## 2020-10-13 DIAGNOSIS — Z3009 Encounter for other general counseling and advice on contraception: Secondary | ICD-10-CM

## 2020-10-13 MED ORDER — LEVONORGEST-ETH ESTRAD 91-DAY 0.15-0.03 &0.01 MG PO TABS: 1.0000 | ORAL_TABLET | Freq: Every day | ORAL | 1 refills | Status: DC

## 2020-10-13 NOTE — Progress Notes (Signed)
HPI:      Ms. Nyelah Emmerich is a 35 y.o. [id number] who LMP was Patient's last menstrual period was 09/29/2020 (approximate).  Subjective:   She presents today to discuss birth control options.  Patient has menstrual migraines for the first 2 days of her menses on monthly OCPs.  She is interested in restarting an OCP that cycles every 3 months and avoids the menstrual migraines.  Patient originally stopped the [facility] because she had spotting daily for 6 weeks during the first pack and did not realize this was a first pack affect only. Other birth control options discussed in detail, especially IUD. Patient's husband was considering vasectomy but urologist "talked him out of it".  In addition, patient is somewhat hesitant about getting her normal period on a monthly basis.    Hx: The following portions of the patient's history were reviewed and updated as appropriate:             She  has a past medical history of Gestational diabetes. She does not have any pertinent problems on file. She  has no past surgical history on file. Her family history includes Healthy in her maternal grandfather, maternal grandmother, mother, and sister; Hypertension in her father. She  reports that she has never smoked. She has never used smokeless tobacco. She reports previous alcohol use. She reports that she does not use drugs. She has a current medication list which includes the following prescription(s): levonorgestrel-ethinyl estradiol. She has No Known Allergies.       Review of Systems:  Review of Systems  Constitutional: Denied constitutional symptoms, night sweats, recent illness, fatigue, fever, insomnia and weight loss.  Eyes: Denied eye symptoms, eye pain, photophobia, vision change and visual disturbance.  Ears/Nose/Throat/Neck: Denied ear, nose, throat or neck symptoms, hearing loss, nasal discharge, sinus congestion and sore throat.  Cardiovascular: Denied cardiovascular symptoms, arrhythmia,  chest pain/pressure, edema, exercise intolerance, orthopnea and palpitations.  Respiratory: Denied pulmonary symptoms, asthma, pleuritic pain, productive sputum, cough, dyspnea and wheezing.  Gastrointestinal: Denied, gastro-esophageal reflux, melena, nausea and vomiting.  Genitourinary: Denied genitourinary symptoms including symptomatic vaginal discharge, pelvic relaxation issues, and urinary complaints.  Musculoskeletal: Denied musculoskeletal symptoms, stiffness, swelling, muscle weakness and myalgia.  Dermatologic: Denied dermatology symptoms, rash and scar.  Neurologic: Denied neurology symptoms, dizziness, headache, neck pain and syncope.  Psychiatric: Denied psychiatric symptoms, anxiety and depression.  Endocrine: Denied endocrine symptoms including hot flashes and night sweats.   Meds:   No current outpatient medications on file prior to visit.   No current facility-administered medications on file prior to visit.       The pregnancy intention screening data noted above was reviewed. Potential methods of contraception were discussed. The patient elected to proceed with Oral Contraceptive.     Objective:     Vitals:   10/13/20 1052  BP: 127/86  Pulse: 73   Filed Weights   10/13/20 1052  Weight: 136 lb 11.2 oz (62 kg)                Assessment:    [id number] Patient Active Problem List   Diagnosis Date Noted  . Normal labor 04/23/2019  . Group B streptococcal carriage complicating pregnancy 04/10/2019  . Gestational thrombocytopenia, third trimester (HCC) 03/12/2019  . Supervision of normal pregnancy 03/12/2019  . History of gestational diabetes in prior pregnancy, currently pregnant 03/12/2019     1. Birth control counseling   2. Initiation of OCP (BCP)     See HPI for additional  information.   Plan:            1.  Both IUD and OCPs discussed in detail.  Effects of hormones on migraines discussed in detail.  Patient has chosen to begin [place].  She  will use protection during the first 3 weeks of use. Orders No orders of the defined types were placed in this encounter.    Meds ordered this encounter  Medications  . Levonorgestrel-Ethinyl Estradiol (AMETHIA) 0.15-0.03 &0.01 MG tablet    Sig: Take 1 tablet by mouth at bedtime.    Dispense:  84 tablet    Refill:  1      F/U  Return in about 6 weeks (around 11/24/2020) for Annual Physical. I spent 24 minutes involved in the care of this patient preparing to see the patient by obtaining and reviewing her medical history (including labs, imaging tests and prior procedures), documenting clinical information in the electronic health record (EHR), counseling and coordinating care plans, writing and sending prescriptions, ordering tests or procedures and directly communicating with the patient by discussing pertinent items from her history and physical exam as well as detailing my assessment and plan as noted above so that she has an informed understanding.  All of her questions were answered. Elonda Husky, M.D. 10/13/2020 11:27 AM

## 2020-11-13 ENCOUNTER — Other Ambulatory Visit: Payer: Self-pay | Admitting: Obstetrics and Gynecology

## 2020-12-01 ENCOUNTER — Encounter: Payer: Commercial Managed Care - PPO | Admitting: Obstetrics and Gynecology

## 2020-12-16 ENCOUNTER — Encounter: Payer: Self-pay | Admitting: Obstetrics and Gynecology

## 2020-12-16 ENCOUNTER — Other Ambulatory Visit: Payer: Self-pay

## 2020-12-16 ENCOUNTER — Ambulatory Visit (INDEPENDENT_AMBULATORY_CARE_PROVIDER_SITE_OTHER): Payer: Commercial Managed Care - PPO | Admitting: Obstetrics and Gynecology

## 2020-12-16 VITALS — BP 117/80 | HR 84 | Ht 64.0 in | Wt 138.1 lb

## 2020-12-16 DIAGNOSIS — N921 Excessive and frequent menstruation with irregular cycle: Secondary | ICD-10-CM

## 2020-12-16 DIAGNOSIS — Z01419 Encounter for gynecological examination (general) (routine) without abnormal findings: Secondary | ICD-10-CM | POA: Diagnosis not present

## 2020-12-16 NOTE — Progress Notes (Signed)
HPI:      [name] is a 35 y.o. [id number] who LMP was Patient's last menstrual period was 10/24/2020.  Subjective:   She presents today for her annual examination.  She reports that she is doing well.  She is on Seasonique for birth control and is having some spotting in the first pack.  The expectation is this will resolve when she begins her second pack.  She is taking the pills as directed.  She is not having issues with headache or other side effects.    Hx: The following portions of the patient's history were reviewed and updated as appropriate:             She  has a past medical history of Gestational diabetes. She does not have any pertinent problems on file. She  has a past surgical history that includes No past surgeries. Her family history includes Breast cancer in her maternal grandmother; Healthy in her maternal grandfather, maternal grandmother, mother, and sister; Hypertension in her father. She  reports that she has never smoked. She has never used smokeless tobacco. She reports current alcohol use. She reports that she does not use drugs. She has a current medication list which includes the following prescription(s): levonorgestrel-ethinyl estradiol. She has No Known Allergies.       Review of Systems:  Review of Systems  Constitutional: Denied constitutional symptoms, night sweats, recent illness, fatigue, fever, insomnia and weight loss.  Eyes: Denied eye symptoms, eye pain, photophobia, vision change and visual disturbance.  Ears/Nose/Throat/Neck: Denied ear, nose, throat or neck symptoms, hearing loss, nasal discharge, sinus congestion and sore throat.  Cardiovascular: Denied cardiovascular symptoms, arrhythmia, chest pain/pressure, edema, exercise intolerance, orthopnea and palpitations.  Respiratory: Denied pulmonary symptoms, asthma, pleuritic pain, productive sputum, cough, dyspnea and wheezing.  Gastrointestinal: Denied, gastro-esophageal reflux, melena,  nausea and vomiting.  Genitourinary: Denied genitourinary symptoms including symptomatic vaginal discharge, pelvic relaxation issues, and urinary complaints.  Musculoskeletal: Denied musculoskeletal symptoms, stiffness, swelling, muscle weakness and myalgia.  Dermatologic: Denied dermatology symptoms, rash and scar.  Neurologic: Denied neurology symptoms, dizziness, headache, neck pain and syncope.  Psychiatric: Denied psychiatric symptoms, anxiety and depression.  Endocrine: Denied endocrine symptoms including hot flashes and night sweats.   Meds:   Current Outpatient Medications on File Prior to Visit  Medication Sig Dispense Refill  . Levonorgestrel-Ethinyl Estradiol (AMETHIA) 0.15-0.03 &0.01 MG tablet Take 1 tablet by mouth at bedtime. 84 tablet 1   No current facility-administered medications on file prior to visit.       The pregnancy intention screening data noted above was reviewed. Potential methods of contraception were discussed. The patient elected to proceed with Oral Contraceptive.     Objective:     Vitals:   12/16/20 0900  BP: 117/80  Pulse: 84    Filed Weights   12/16/20 0900  Weight: 138 lb 1.6 oz (62.6 kg)              Physical examination General NAD, Conversant  HEENT Atraumatic; Op clear with mmm.  Normo-cephalic. Pupils reactive. Anicteric sclerae  Thyroid/Neck Smooth without nodularity or enlargement. Normal ROM.  Neck Supple.  Skin No rashes, lesions or ulceration. Normal palpated skin turgor. No nodularity.  Breasts: No masses or discharge.  Symmetric.  No axillary adenopathy.  Lungs: Clear to auscultation.No rales or wheezes. Normal Respiratory effort, no retractions.  Heart: NSR.  No murmurs or rubs appreciated. No periferal edema  Abdomen: Soft.  Non-tender.  No masses.  No HSM.  No hernia  Extremities: Moves all appropriately.  Normal ROM for age. No lymphadenopathy.  Neuro: Oriented to PPT.  Normal mood. Normal affect.     Pelvic:   Vulva:  Normal appearance.  No lesions.  Vagina: No lesions or abnormalities noted.  Support: Normal pelvic support.  Urethra No masses tenderness or scarring.  Meatus Normal size without lesions or prolapse.  Cervix: Normal appearance.  No lesions.  Anus: Normal exam.  No lesions.  Perineum: Normal exam.  No lesions.        Bimanual   Uterus: Normal size.  Non-tender.  Mobile.  AV.  Adnexae: No masses.  Non-tender to palpation.  Cul-de-sac: Negative for abnormality.      Assessment:    [id number] Patient Active Problem List   Diagnosis Date Noted  . Normal labor 04/23/2019  . Group B streptococcal carriage complicating pregnancy 04/10/2019  . Gestational thrombocytopenia, third trimester (HCC) 03/12/2019  . Supervision of normal pregnancy 03/12/2019  . History of gestational diabetes in prior pregnancy, currently pregnant 03/12/2019     1. Well woman exam with routine gynecological exam   2. Breakthrough bleeding on OCPs        Plan:            1.  Basic Screening Recommendations The basic screening recommendations for asymptomatic women were discussed with the patient during her visit.  The age-appropriate recommendations were discussed with her and the rational for the tests reviewed.  When I am informed by the patient that another primary care physician has previously obtained the age-appropriate tests and they are up-to-date, only outstanding tests are ordered and referrals given as necessary.  Abnormal results of tests will be discussed with her when all of her results are completed.  Routine preventative health maintenance measures emphasized: Exercise/Diet/Weight control, Tobacco Warnings, Alcohol/Substance use risks and Stress Management Patient to return for fasting blood work.  Pap due next year. Grandmother with a history of breast cancer patient to consider mammography at 35 years old. 2.  Expectant management of spotting on OCPs.  If this does not resolve in the second  pack patient to consider monthly cycling versus IUD.  She will contact us. Orders Orders Placed This Encounter  Procedures  . CBC  . Lipid panel  . TSH  . Hemoglobin A1c    No orders of the defined types were placed in this encounter.         F/U  Return in about 1 year (around 12/16/2021) for Annual Physical.  Elonda Husky, M.D. 12/16/2020 9:34 AM

## 2021-04-10 ENCOUNTER — Other Ambulatory Visit: Payer: Self-pay | Admitting: Obstetrics and Gynecology

## 2021-04-10 DIAGNOSIS — Z30011 Encounter for initial prescription of contraceptive pills: Secondary | ICD-10-CM

## 2021-04-10 DIAGNOSIS — Z3009 Encounter for other general counseling and advice on contraception: Secondary | ICD-10-CM

## 2021-10-06 ENCOUNTER — Other Ambulatory Visit: Payer: Self-pay | Admitting: Obstetrics and Gynecology

## 2021-10-06 DIAGNOSIS — Z30011 Encounter for initial prescription of contraceptive pills: Secondary | ICD-10-CM

## 2021-10-06 DIAGNOSIS — Z3009 Encounter for other general counseling and advice on contraception: Secondary | ICD-10-CM

## 2021-12-16 ENCOUNTER — Other Ambulatory Visit (HOSPITAL_COMMUNITY)
Admission: RE | Admit: 2021-12-16 | Discharge: 2021-12-16 | Disposition: A | Discharge status: Home / Self Care | Payer: 59 | Source: Ambulatory Visit | Attending: Obstetrics and Gynecology | Admitting: Obstetrics and Gynecology

## 2021-12-16 ENCOUNTER — Ambulatory Visit (INDEPENDENT_AMBULATORY_CARE_PROVIDER_SITE_OTHER): Payer: 59 | Admitting: Obstetrics and Gynecology

## 2021-12-16 ENCOUNTER — Other Ambulatory Visit: Payer: Self-pay

## 2021-12-16 ENCOUNTER — Encounter: Payer: Self-pay | Admitting: Obstetrics and Gynecology

## 2021-12-16 VITALS — BP 130/90 | HR 76 | Ht 64.0 in | Wt 146.5 lb

## 2021-12-16 DIAGNOSIS — Z01419 Encounter for gynecological examination (general) (routine) without abnormal findings: Secondary | ICD-10-CM | POA: Insufficient documentation

## 2021-12-16 DIAGNOSIS — Z124 Encounter for screening for malignant neoplasm of cervix: Secondary | ICD-10-CM

## 2021-12-16 NOTE — Progress Notes (Signed)
HPI: ?     [name] is a 36 y.o. [id number] who LMP was Patient's last menstrual period was 10/11/2021 (approximate). ? ?Subjective:  ? ?She presents today for her annual examination.  She reports she is generally doing well.  Having menses every 3 months.  She has noticed that her headaches have increased and she wonders if it has anything to do with the OCPs.  She reports that at least 2 times per month she gets a severe headache. ?She feels as if she has completed childbearing and has heard about the IUD.  She would like to consider this for birth control or a change in OCPs. ? ?  Hx: ?The following portions of the patient's history were reviewed and updated as appropriate: ?            She  has a past medical history of Gestational diabetes. ?She does not have any pertinent problems on file. ?She  has a past surgical history that includes No past surgeries. ?Her family history includes Breast cancer in her maternal grandmother; Healthy in her maternal grandfather, maternal grandmother, mother, and sister; Hypertension in her father. ?She  reports that she has never smoked. She has never used smokeless tobacco. She reports current alcohol use. She reports that she does not use drugs. ?She has a current medication list which includes the following prescription(s): simpesse. ?She has No Known Allergies. ?      ?Review of Systems:  ?Review of Systems ? ?Constitutional: Denied constitutional symptoms, night sweats, recent illness, fatigue, fever, insomnia and weight loss.  ?Eyes: Denied eye symptoms, eye pain, photophobia, vision change and visual disturbance.  ?Ears/Nose/Throat/Neck: Denied ear, nose, throat or neck symptoms, hearing loss, nasal discharge, sinus congestion and sore throat.  ?Cardiovascular: Denied cardiovascular symptoms, arrhythmia, chest pain/pressure, edema, exercise intolerance, orthopnea and palpitations.  ?Respiratory: Denied pulmonary symptoms, asthma, pleuritic pain, productive  sputum, cough, dyspnea and wheezing.  ?Gastrointestinal: Denied, gastro-esophageal reflux, melena, nausea and vomiting.  ?Genitourinary: Denied genitourinary symptoms including symptomatic vaginal discharge, pelvic relaxation issues, and urinary complaints.  ?Musculoskeletal: Denied musculoskeletal symptoms, stiffness, swelling, muscle weakness and myalgia.  ?Dermatologic: Denied dermatology symptoms, rash and scar.  ?Neurologic: See HPI for additional information.  ?Psychiatric: Denied psychiatric symptoms, anxiety and depression.  ?Endocrine: See HPI for additional information.  ? ?Meds: ?  ?Current Outpatient Medications on File Prior to Visit  ?Medication Sig Dispense Refill  ? SIMPESSE 0.15-0.03 &0.01 MG tablet TAKE 1 TABLET BY MOUTH AT BEDTIME 91 tablet 1  ? ?No current facility-administered medications on file prior to visit.  ? ? ? ?Objective:  ?  ? ?Vitals:  ? 12/16/21 0956  ?BP: 130/90  ?Pulse: 76  ?  ?Filed Weights  ? 12/16/21 0956  ?Weight: 146 lb 8 oz (66.5 kg)  ? ?  ?         Physical examination ?General NAD, Conversant  ?HEENT Atraumatic; Op clear with mmm.  Normo-cephalic. Pupils reactive. Anicteric sclerae  ?Thyroid/Neck Smooth without nodularity or enlargement. Normal ROM.  Neck Supple.  ?Skin No rashes, lesions or ulceration. Normal palpated skin turgor. No nodularity.  ?Breasts: No masses or discharge.  Symmetric.  No axillary adenopathy.  ?Lungs: Clear to auscultation.No rales or wheezes. Normal Respiratory effort, no retractions.  ?Heart: NSR.  No murmurs or rubs appreciated. No periferal edema  ?Abdomen: Soft.  Non-tender.  No masses.  No HSM. No hernia  ?Extremities: Moves all appropriately.  Normal ROM for age. No lymphadenopathy.  ?Neuro: Oriented to  PPT.  Normal mood. Normal affect.  ? ?  Pelvic:   ?Vulva: Normal appearance.  No lesions.  ?Vagina: No lesions or abnormalities noted.  ?Support: Normal pelvic support.  ?Urethra No masses tenderness or scarring.  ?Meatus Normal size without  lesions or prolapse.  ?Cervix: Normal appearance.  No lesions.  ?Anus: Normal exam.  No lesions.  ?Perineum: Normal exam.  No lesions.  ?      Bimanual   ?Uterus: Normal size.  Non-tender.  Mobile.  AV.  ?Adnexae: No masses.  Non-tender to palpation.  ?Cul-de-sac: Negative for abnormality.  ? ? ? ?Assessment:  ?  ?G2P2002 ?Patient Active Problem List  ? Diagnosis Date Noted  ? Normal labor 04/23/2019  ? Group B streptococcal carriage complicating pregnancy [id number]  ? Gestational thrombocytopenia, third trimester ([facility]) 03/12/2019  ? Supervision of normal pregnancy 03/12/2019  ? History of gestational diabetes in prior pregnancy, currently pregnant 03/12/2019  ? ?  ?1. Well woman exam with routine gynecological exam   ?2. Cervical cancer screening   ? ? Patient getting headaches which seem to be occurring more frequently.  She is unsure if it is related to her OCPs or not.  She would like to consider other forms of birth control.  She has specifically asked about the IUD. ? ? ?Plan:  ?  ?       ? 1.  Basic Screening Recommendations ?The basic screening recommendations for asymptomatic women were discussed with the patient during her visit.  The age-appropriate recommendations were discussed with her and the rational for the tests reviewed.  When I am informed by the patient that another primary care physician has previously obtained the age-appropriate tests and they are up-to-date, only outstanding tests are ordered and referrals given as necessary.  Abnormal results of tests will be discussed with her when all of her results are completed.  Routine preventative health maintenance measures emphasized: Exercise/Diet/Weight control, Tobacco Warnings, Alcohol/Substance use risks and Stress Management ?Pap performed. -Lab work today ?2.  We have discussed Mirena IUD in detail.  All of her questions were answered.  She has decided to return for IUD placement. ? ?Orders ?Orders Placed This Encounter  ?Procedures  ?  Basic metabolic panel  ? CBC  ? TSH  ? Lipid panel  ? Hemoglobin A1c  ? ? No orders of the defined types were placed in this encounter. ?   ?  ?  F/U ? Return in about 2 weeks (around 12/30/2021). ? ?Finis Bud, M.D. ?12/16/2021 ?11:17 AM ? ? ? ?

## 2021-12-16 NOTE — Progress Notes (Signed)
Patients presents for annual exam today. Patient states doing well. She states a concern regarding headaches that seem to be associated with her OCP and menstrual cycles. She states getting approximately one horrible headache a week. Patient is due for pap smear, ordered. Patient states no other questions or concerns at this time.   ?

## 2021-12-17 LAB — BASIC METABOLIC PANEL
BUN/Creatinine Ratio: 15 (ref 9–23)
BUN: 12 mg/dL (ref 6–20)
CO2: 21 mmol/L (ref 20–29)
Calcium: 10.5 mg/dL — ABNORMAL HIGH (ref 8.7–10.2)
Chloride: 99 mmol/L (ref 96–106)
Creatinine, Ser: 0.78 mg/dL (ref 0.57–1.00)
Glucose: 78 mg/dL (ref 70–99)
Potassium: 4.5 mmol/L (ref 3.5–5.2)
Sodium: 137 mmol/L (ref 134–144)
eGFR: 102 mL/min/{1.73_m2} (ref >59)

## 2021-12-17 LAB — HEMOGLOBIN A1C
Est. average glucose Bld gHb Est-mCnc: 105 mg/dL
Hgb A1c MFr Bld: 5.3 % (ref 4.8–5.6)

## 2021-12-17 LAB — CBC
Hematocrit: 44.2 % (ref 34.0–46.6)
Hemoglobin: 15 g/dL (ref 11.1–15.9)
MCH: 31.6 pg (ref 26.6–33.0)
MCHC: 33.9 g/dL (ref 31.5–35.7)
MCV: 93 fL (ref 79–97)
Platelets: 247 10*3/uL (ref 150–450)
RBC: 4.74 x10E6/uL (ref 3.77–5.28)
RDW: 11.4 % — ABNORMAL LOW (ref 11.7–15.4)
WBC: 7.5 10*3/uL (ref 3.4–10.8)

## 2021-12-17 LAB — LIPID PANEL
Chol/HDL Ratio: 3.1 ratio (ref 0.0–4.4)
Cholesterol, Total: 194 mg/dL (ref 100–199)
HDL: 62 mg/dL (ref >39)
LDL Chol Calc (NIH): 112 mg/dL — ABNORMAL HIGH (ref 0–99)
Triglycerides: 112 mg/dL (ref 0–149)
VLDL Cholesterol Cal: 20 mg/dL (ref 5–40)

## 2021-12-17 LAB — CYTOLOGY - PAP
Comment: NEGATIVE
Diagnosis: NEGATIVE
High risk HPV: NEGATIVE

## 2021-12-17 LAB — TSH: TSH: 1.62 u[IU]/mL (ref 0.450–4.500)

## 2021-12-21 ENCOUNTER — Encounter: Payer: Self-pay | Admitting: Obstetrics and Gynecology

## 2021-12-30 ENCOUNTER — Encounter: Payer: 59 | Admitting: Obstetrics and Gynecology

## 2022-01-06 ENCOUNTER — Ambulatory Visit: Payer: 59 | Admitting: Obstetrics and Gynecology

## 2022-01-06 ENCOUNTER — Encounter: Payer: Self-pay | Admitting: Obstetrics and Gynecology

## 2022-01-06 VITALS — BP 154/91 | HR 77 | Ht 64.0 in | Wt 139.8 lb

## 2022-01-06 DIAGNOSIS — Z3202 Encounter for pregnancy test, result negative: Secondary | ICD-10-CM | POA: Diagnosis not present

## 2022-01-06 DIAGNOSIS — Z30430 Encounter for insertion of intrauterine contraceptive device: Secondary | ICD-10-CM

## 2022-01-06 LAB — POCT URINE PREGNANCY: Preg Test, Ur: NEGATIVE

## 2022-01-06 NOTE — Progress Notes (Signed)
Patient presents today for IUD insertion. She states she is slightly nervous about the procedure but would like to continue. Patient would like the Mirena IUD. UPT is negative. Patient states no other questions or concerns.  ?

## 2022-01-06 NOTE — Progress Notes (Signed)
HPI: ?     [name] is a 36 y.o. [id number] who LMP was Patient's last menstrual period was 01/01/2022 (exact date). ? ?Subjective:  ? ?She presents today for IUD insertion.  She had been on OCPs but has noticed increasing headaches.  She would like some form of birth control but is not inclined to increase her headaches.  She has chosen IUD. ? ?  Hx: ?The following portions of the patient's history were reviewed and updated as appropriate: ?            She  has a past medical history of Gestational diabetes. ?She does not have any pertinent problems on file. ?She  has a past surgical history that includes No past surgeries. ?Her family history includes Breast cancer in her maternal grandmother; Healthy in her maternal grandfather, maternal grandmother, mother, and sister; Hypertension in her father. ?She  reports that she has never smoked. She has never used smokeless tobacco. She reports that she does not currently use alcohol. She reports that she does not use drugs. ?She has a current medication list which includes the following prescription(s): multiple vitamins-minerals. ?She has No Known Allergies. ?      ?Review of Systems:  ?Review of Systems ? ?Constitutional: Denied constitutional symptoms, night sweats, recent illness, fatigue, fever, insomnia and weight loss.  ?Eyes: Denied eye symptoms, eye pain, photophobia, vision change and visual disturbance.  ?Ears/Nose/Throat/Neck: Denied ear, nose, throat or neck symptoms, hearing loss, nasal discharge, sinus congestion and sore throat.  ?Cardiovascular: Denied cardiovascular symptoms, arrhythmia, chest pain/pressure, edema, exercise intolerance, orthopnea and palpitations.  ?Respiratory: Denied pulmonary symptoms, asthma, pleuritic pain, productive sputum, cough, dyspnea and wheezing.  ?Gastrointestinal: Denied, gastro-esophageal reflux, melena, nausea and vomiting.  ?Genitourinary: Denied genitourinary symptoms including symptomatic vaginal  discharge, pelvic relaxation issues, and urinary complaints.  ?Musculoskeletal: Denied musculoskeletal symptoms, stiffness, swelling, muscle weakness and myalgia.  ?Dermatologic: Denied dermatology symptoms, rash and scar.  ?Neurologic: Denied neurology symptoms, dizziness, headache, neck pain and syncope.  ?Psychiatric: Denied psychiatric symptoms, anxiety and depression.  ?Endocrine: Denied endocrine symptoms including hot flashes and night sweats.  ? ?Meds: ?  ?Current Outpatient Medications on File Prior to Visit  ?Medication Sig Dispense Refill  ? Multiple Vitamins-Minerals (MULTIVITAL PO) Take by mouth.    ? ?No current facility-administered medications on file prior to visit.  ? ? ?Objective:  ?  ? ?Vitals:  ? 01/06/22 0954  ?BP: (!) 154/91  ?Pulse: 77  ? ? Physical examination ?  Pelvic:   ?Vulva: Normal appearance.  No lesions.  ?Vagina: No lesions or abnormalities noted.  ?Support: Normal pelvic support.  ?Urethra No masses tenderness or scarring.  ?Meatus Normal size without lesions or prolapse.  ?Cervix: Normal appearance.  No lesions.  ?Anus: Normal exam.  No lesions.  ?Perineum: Normal exam.  No lesions.  ?      Bimanual   ?Uterus: Normal size.  Non-tender.  Mobile.  AV.  ?Adnexae: No masses.  Non-tender to palpation.  ?Cul-de-sac: Negative for abnormality.  ? ?IUD Procedure ?Pt has read the booklet and signed the appropriate forms regarding the Mirena IUD.  All of her questions have been answered.   ?The cervix was cleansed with betadine solution.  After sounding the uterus and noting the position, the IUD was placed in the usual manner without problem.  The string was cut to the appropriate length.  The patient tolerated the procedure well. ?  ?         NDC # =  35465-681-27 ? ? ?Assessment:  ?  ?G2P2002 ?Patient Active Problem List  ? Diagnosis Date Noted  ? Normal labor 04/23/2019  ? Group B streptococcal carriage complicating pregnancy 04/10/2019  ? Gestational thrombocytopenia, third trimester  (HCC) 03/12/2019  ? Supervision of normal pregnancy 03/12/2019  ? History of gestational diabetes in prior pregnancy, currently pregnant 03/12/2019  ? ?  ?1. Encounter for IUD insertion   ? ?  ? ?Plan:  ?  ?       ?  F/U ? Return in about 4 weeks (around 02/03/2022). ? ?Elonda Husky, M.D. ?01/06/2022 ?10:34 AM ? ?

## 2022-02-03 ENCOUNTER — Encounter: Payer: 59 | Admitting: Obstetrics and Gynecology

## 2022-02-03 DIAGNOSIS — Z30431 Encounter for routine checking of intrauterine contraceptive device: Secondary | ICD-10-CM

## 2022-02-24 ENCOUNTER — Encounter: Payer: 59 | Admitting: Obstetrics and Gynecology

## 2022-02-24 DIAGNOSIS — Z30431 Encounter for routine checking of intrauterine contraceptive device: Secondary | ICD-10-CM

## 2022-03-09 ENCOUNTER — Ambulatory Visit: Payer: 59 | Admitting: Obstetrics and Gynecology

## 2022-03-09 ENCOUNTER — Encounter: Payer: Self-pay | Admitting: Obstetrics and Gynecology

## 2022-03-09 VITALS — BP 119/88 | HR 89 | Ht 64.0 in | Wt 137.0 lb

## 2022-03-09 DIAGNOSIS — Z30431 Encounter for routine checking of intrauterine contraceptive device: Secondary | ICD-10-CM | POA: Diagnosis not present

## 2022-03-09 NOTE — Progress Notes (Signed)
HPI:      [name] is a 36 y.o. [id number] who LMP was No LMP recorded. (Menstrual status: IUD).  Subjective:   She presents today for follow-up of her IUD.  She reports that her headaches have completely resolved.  !! She is very excited about this. She does state that her husband can feel the strings during intercourse and that she feels a pulling sensation during intercourse.  She otherwise has no issues.  She reports her bleeding has stopped.    Hx: The following portions of the patient's history were reviewed and updated as appropriate:             She  has a past medical history of Gestational diabetes. She does not have any pertinent problems on file. She  has a past surgical history that includes No past surgeries. Her family history includes Breast cancer in her maternal grandmother; Healthy in her maternal grandfather, maternal grandmother, mother, and sister; Hypertension in her father. She  reports that she has never smoked. She has never used smokeless tobacco. She reports that she does not currently use alcohol. She reports that she does not use drugs. She has a current medication list which includes the following prescription(s): multiple vitamins-minerals. She has No Known Allergies.       Review of Systems:  Review of Systems  Constitutional: Denied constitutional symptoms, night sweats, recent illness, fatigue, fever, insomnia and weight loss.  Eyes: Denied eye symptoms, eye pain, photophobia, vision change and visual disturbance.  Ears/Nose/Throat/Neck: Denied ear, nose, throat or neck symptoms, hearing loss, nasal discharge, sinus congestion and sore throat.  Cardiovascular: Denied cardiovascular symptoms, arrhythmia, chest pain/pressure, edema, exercise intolerance, orthopnea and palpitations.  Respiratory: Denied pulmonary symptoms, asthma, pleuritic pain, productive sputum, cough, dyspnea and wheezing.  Gastrointestinal: Denied, gastro-esophageal reflux,  melena, nausea and vomiting.  Genitourinary: See HPI for additional information.  Musculoskeletal: Denied musculoskeletal symptoms, stiffness, swelling, muscle weakness and myalgia.  Dermatologic: Denied dermatology symptoms, rash and scar.  Neurologic: Denied neurology symptoms, dizziness, headache, neck pain and syncope.  Psychiatric: Denied psychiatric symptoms, anxiety and depression.  Endocrine: Denied endocrine symptoms including hot flashes and night sweats.   Meds:   Current Outpatient Medications on File Prior to Visit  Medication Sig Dispense Refill   Multiple Vitamins-Minerals (MULTIVITAL PO) Take by mouth.     No current facility-administered medications on file prior to visit.      Objective:     Vitals:   03/09/22 1112  BP: 119/88  Pulse: 89   Filed Weights   03/09/22 1112  Weight: 137 lb (62.1 kg)              Physical examination   Pelvic:   Vulva: Normal appearance.  No lesions.  Vagina: No lesions or abnormalities noted.  Support: Normal pelvic support.  Urethra No masses tenderness or scarring.  Meatus Normal size without lesions or prolapse.  Cervix: Normal appearance.  No lesions. IUD strings noted at cervical os.  Anus: Normal exam.  No lesions.  Perineum: Normal exam.  No lesions.        Bimanual   Uterus: Normal size.  Non-tender.  Mobile.  AV.  Adnexae: No masses.  Non-tender to palpation.  Cul-de-sac: Negative for abnormality.             Assessment:    [id number] Patient Active Problem List   Diagnosis Date Noted   Normal labor 04/23/2019   Group B streptococcal carriage complicating pregnancy 04/10/2019  Gestational thrombocytopenia, third trimester ([facility]) 03/12/2019   Supervision of normal pregnancy 03/12/2019   History of gestational diabetes in prior pregnancy, currently pregnant 03/12/2019     1. Encounter for routine checking of intrauterine contraceptive device (IUD)     IUD strings are appropriate length but I shortened  them today because of the patient and her partners issue with feeling them.  They are still visible at the cervical os although short.   Plan:            1.  Strings shortened today.  2.  Expectant management of pulling sensation with intercourse.  I do expect this to resolve.  If not consider pelvic ultrasound for ovarian cyst/IUD location. Orders No orders of the defined types were placed in this encounter.   No orders of the defined types were placed in this encounter.     F/U  No follow-ups on file. I spent 13 minutes involved in the care of this patient preparing to see the patient by obtaining and reviewing her medical history (including labs, imaging tests and prior procedures), documenting clinical information in the electronic health record (EHR), counseling and coordinating care plans, writing and sending prescriptions, ordering tests or procedures and in direct communicating with the patient and medical staff discussing pertinent items from her history and physical exam.  Finis Bud, M.D. 03/09/2022 11:26 AM

## 2022-03-09 NOTE — Progress Notes (Signed)
Patient presents today for IUD string check. She states she is no longer bleeding. Patient reports discomfort with intercourse, states partner can feel strings. No other questions or concerns.

## 2022-08-22 ENCOUNTER — Encounter: Payer: Self-pay | Admitting: General Practice

## 2022-09-05 ENCOUNTER — Ambulatory Visit: Payer: 59 | Admitting: Family Medicine

## 2022-10-24 ENCOUNTER — Encounter: Payer: Self-pay | Admitting: General Practice

## 2022-11-03 ENCOUNTER — Ambulatory Visit: Payer: 59 | Admitting: Family Medicine

## 2023-01-05 ENCOUNTER — Encounter: Payer: Self-pay | Admitting: General Practice

## 2023-01-19 ENCOUNTER — Ambulatory Visit: Payer: 59 | Admitting: Family Medicine

## 2023-01-19 ENCOUNTER — Encounter: Payer: Self-pay | Admitting: Family Medicine

## 2023-01-19 VITALS — BP 108/78 | HR 98 | Ht 64.0 in | Wt 130.4 lb

## 2023-01-19 DIAGNOSIS — L729 Follicular cyst of the skin and subcutaneous tissue, unspecified: Secondary | ICD-10-CM | POA: Diagnosis not present

## 2023-01-19 DIAGNOSIS — Z7689 Persons encountering health services in other specified circumstances: Secondary | ICD-10-CM | POA: Diagnosis not present

## 2023-01-19 DIAGNOSIS — Z1283 Encounter for screening for malignant neoplasm of skin: Secondary | ICD-10-CM | POA: Diagnosis not present

## 2023-01-19 HISTORY — DX: Encounter for screening for malignant neoplasm of skin: Z12.83

## 2023-01-19 NOTE — Patient Instructions (Signed)
-   Referral coordinator will contact you to schedule visit with dermatology - Return for physical

## 2023-01-25 ENCOUNTER — Encounter: Payer: 59 | Admitting: Family Medicine

## 2023-01-29 NOTE — Assessment & Plan Note (Signed)
Reassuring clinical features, refer to dermatology for input.

## 2023-01-29 NOTE — Assessment & Plan Note (Signed)
Reviewed and updated past medical, surgical, and family history.  +S1, S2, RRR, no additional heart sounds, CTA bl, no WRR  - Return for physical

## 2023-01-29 NOTE — Assessment & Plan Note (Signed)
Refer to dermatology to establish care and for routine skin checks.

## 2023-01-29 NOTE — Progress Notes (Signed)
     Primary Care / Sports Medicine Office Visit  Patient Information:  Patient ID: Danielle Chen, female DOB: [birth year] Age: 37 y.o. MRN: [id number]   Danielle Chen is a pleasant 37 y.o. female presenting with the following:  Chief Complaint  Patient presents with   Establish Care    Vitals:   01/19/23 1408  BP: 108/78  Pulse: 98  SpO2: 96%   Vitals:   01/19/23 1408  Weight: 130 lb 6.4 oz (59.1 kg)  Height:  (1.626 m)   Body mass index is 22.38 kg/m.  No results found.   Independent interpretation of notes and tests performed by another provider:   None  Procedures performed:   None  Pertinent History, Exam, Impression, and Recommendations:   Danielle Chen was seen today for establish care.  Scalp cyst Assessment & Plan: Reassuring clinical features, refer to dermatology for input.  Orders: -     Ambulatory referral to Dermatology  Skin cancer screening Assessment & Plan: Refer to dermatology to establish care and for routine skin checks.  Orders: -     Ambulatory referral to Dermatology  Encounter to establish care Assessment & Plan: Reviewed and updated past medical, surgical, and family history.  +S1, S2, RRR, no additional heart sounds, CTA bl, no WRR  - Return for physical    I provided a total time of 33 minutes including both face-to-face and non-face-to-face time on 01/29/2023 inclusive of time utilized for medical chart review, information gathering, care coordination with staff, and documentation completion.   Orders & Medications No orders of the defined types were placed in this encounter.  Orders Placed This Encounter  Procedures   Ambulatory referral to Dermatology     Return in about 4 weeks (around 02/16/2023) for CPE.     Jerrol Banana, MD, [facility]   Primary Care Sports Medicine Primary Care and Sports Medicine at [facility]

## 2023-03-09 ENCOUNTER — Ambulatory Visit (INDEPENDENT_AMBULATORY_CARE_PROVIDER_SITE_OTHER): Payer: 59 | Admitting: Family Medicine

## 2023-03-09 ENCOUNTER — Encounter: Payer: Self-pay | Admitting: Family Medicine

## 2023-03-09 VITALS — BP 130/96 | HR 67 | Ht 64.0 in | Wt 132.0 lb

## 2023-03-09 DIAGNOSIS — Z1159 Encounter for screening for other viral diseases: Secondary | ICD-10-CM

## 2023-03-09 DIAGNOSIS — Z0000 Encounter for general adult medical examination without abnormal findings: Secondary | ICD-10-CM | POA: Diagnosis not present

## 2023-03-09 DIAGNOSIS — Z1283 Encounter for screening for malignant neoplasm of skin: Secondary | ICD-10-CM

## 2023-03-09 DIAGNOSIS — Z13220 Encounter for screening for lipoid disorders: Secondary | ICD-10-CM

## 2023-03-09 DIAGNOSIS — E559 Vitamin D deficiency, unspecified: Secondary | ICD-10-CM | POA: Diagnosis not present

## 2023-03-09 DIAGNOSIS — L729 Follicular cyst of the skin and subcutaneous tissue, unspecified: Secondary | ICD-10-CM

## 2023-03-09 NOTE — Assessment & Plan Note (Signed)
Annual examination completed, risk stratification labs ordered, anticipatory guidance provided.  We will follow labs once resulted. 

## 2023-03-09 NOTE — Progress Notes (Signed)
Annual Physical Exam Visit  Patient Information:  Patient ID: Danielle Chen, female DOB: [birth year] Age: 37 y.o. MRN: [id number]   Subjective:   CC: Annual Physical Exam  HPI:  Danielle Chen is here for their annual physical.  I reviewed the past medical history, family history, social history, surgical history, and allergies today and changes were made as necessary.  Please see the problem list section below for additional details.  Past Medical History: Past Medical History:  Diagnosis Date   Gestational diabetes 2018   Skin cancer screening 01/19/2023   Past Surgical History: Past Surgical History:  Procedure Laterality Date   NO PAST SURGERIES     Family History: Family History  Problem Relation Age of Onset   Healthy Mother    Hypertension Father    Anxiety disorder Father    Hyperlipidemia Father    Healthy Sister    Healthy Maternal Grandmother    Breast cancer Maternal Grandmother 62 - 51   Healthy Maternal Grandfather    Diabetes Paternal Grandfather 40 - 51   Diabetes Paternal Aunt 49 - 59   Ovarian cancer Neg Hx    Colon cancer Neg Hx    Allergies: Allergies  Allergen Reactions   Motrin [Ibuprofen] Hives   Health Maintenance: Health Maintenance  Topic Date Due   Hepatitis C Screening  Never done   COVID-19 Vaccine (3 - 2023-24 season) 06/10/2022   INFLUENZA VACCINE  05/11/2023   PAP SMEAR-Modifier  12/16/2024   DTaP/Tdap/Td (2 - Td or Tdap) 02/18/2029   HIV Screening  Completed   HPV VACCINES  Aged Out    HM Colonoscopy     This patient has no relevant Health Maintenance data.      Medications: Current Outpatient Medications on File Prior to Visit  Medication Sig Dispense Refill   levonorgestrel (MIRENA) 20 MCG/DAY IUD 1 each by Intrauterine route once.     Multiple Vitamins-Minerals (MULTIVITAL PO) Take by mouth.     No current facility-administered medications on file prior to visit.    Review of Systems: No headache,  visual changes, nausea, vomiting, diarrhea, constipation, dizziness, abdominal pain, skin rash, fevers, chills, night sweats, swollen lymph nodes, weight loss, chest pain, body aches, joint swelling, muscle aches, shortness of breath, mood changes, visual or auditory hallucinations reported.  Objective:   Vitals:   03/09/23 0853 03/09/23 0854  BP: (!) 132/90 (!) 130/96  Pulse: 67   SpO2: 98%    Vitals:   03/09/23 0853  Weight: 132 lb (59.9 kg)  Height: 5\' 4"  (1.626 m)   Body mass index is 22.66 kg/m.  General: Well Developed, well nourished, and in no acute distress.  Neuro: Alert and oriented x3, extra-ocular muscles intact, sensation grossly intact. Cranial nerves II through XII are grossly intact, motor, sensory, and coordinative functions are intact. HEENT: Normocephalic, atraumatic, pupils equal round reactive to light, neck supple, no masses, no lymphadenopathy, thyroid nonpalpable. Oropharynx, nasopharynx, external ear canals are unremarkable. Skin: Warm and dry, no rashes noted.  Cardiac: Regular rate and rhythm, no murmurs rubs or gallops. No peripheral edema. Pulses symmetric. Respiratory: Clear to auscultation bilaterally. Not using accessory muscles, speaking in full sentences.  Abdominal: Soft, nontender, nondistended, positive bowel sounds, no masses, no organomegaly. Musculoskeletal: Shoulder, elbow, wrist, hip, knee, ankle stable, and with full range of motion.  Female chaperone initials: KG present throughout the physical examination.  Impression and Recommendations:   The patient was counselled, risk factors were discussed, and anticipatory  guidance given.  Problem List Items Addressed This Visit       Musculoskeletal and Integument   Scalp cyst    Dermatology visit scheduled 01/2024, advised patient about alternate options and if she finds a group we can send a referral.         Other   Healthcare maintenance - Primary    Annual examination completed,  risk stratification labs ordered, anticipatory guidance provided.  We will follow labs once resulted.      Relevant Orders   CBC   Comprehensive metabolic panel   Hepatitis C antibody   Lipid panel   VITAMIN D 25 Hydroxy (Vit-D Deficiency, Fractures)   TSH   Skin cancer screening    See additional assessment(s) for plan details.      Other Visit Diagnoses     Annual physical exam       Relevant Orders   CBC   Comprehensive metabolic panel   Hepatitis C antibody   Lipid panel   VITAMIN D 25 Hydroxy (Vit-D Deficiency, Fractures)   TSH   Vitamin D deficiency       Relevant Orders   VITAMIN D 25 Hydroxy (Vit-D Deficiency, Fractures)   Screening for lipoid disorders       Relevant Orders   Comprehensive metabolic panel   Lipid panel   Need for hepatitis C screening test       Relevant Orders   Hepatitis C antibody        Orders & Medications Medications: No orders of the defined types were placed in this encounter.  Orders Placed This Encounter  Procedures   CBC   Comprehensive metabolic panel   Hepatitis C antibody   Lipid panel   VITAMIN D 25 Hydroxy (Vit-D Deficiency, Fractures)   TSH     Return in about 1 year (around 03/08/2024) for CPE.    Jerrol Banana, MD, [facility]   Primary Care Sports Medicine Primary Care and Sports Medicine at [facility]

## 2023-03-09 NOTE — Patient Instructions (Signed)
-   Obtain fasting labs with orders provided (can have water or black coffee but otherwise no food or drink x 8 hours before labs) - Review information provided - Attend eye doctor annually, dentist every 6 months, work towards or maintain 30 minutes of moderate intensity physical activity at least 5 days per week, and consume a balanced diet - Return in 1 year for physical - Contact us for any questions between now and then  Additionally: - Contact us if you find an alternate dermatology group so that we can send a referral

## 2023-03-09 NOTE — Assessment & Plan Note (Signed)
Dermatology visit scheduled 01/2024, advised patient about alternate options and if she finds a group we can send a referral.

## 2023-03-09 NOTE — Assessment & Plan Note (Signed)
See additional assessment(s) for plan details. 

## 2023-03-10 LAB — COMPREHENSIVE METABOLIC PANEL
ALT: 11 IU/L (ref 0–32)
AST: 17 IU/L (ref 0–40)
Albumin/Globulin Ratio: 1.4 (ref 1.2–2.2)
Albumin: 4.6 g/dL (ref 3.9–4.9)
Alkaline Phosphatase: 100 IU/L (ref 44–121)
BUN/Creatinine Ratio: 17 (ref 9–23)
BUN: 12 mg/dL (ref 6–20)
Bilirubin Total: 0.2 mg/dL (ref 0.0–1.2)
CO2: 22 mmol/L (ref 20–29)
Calcium: 10.1 mg/dL (ref 8.7–10.2)
Chloride: 101 mmol/L (ref 96–106)
Creatinine, Ser: 0.71 mg/dL (ref 0.57–1.00)
Globulin, Total: 3.3 g/dL (ref 1.5–4.5)
Glucose: 86 mg/dL (ref 70–99)
Potassium: 4.4 mmol/L (ref 3.5–5.2)
Sodium: 138 mmol/L (ref 134–144)
Total Protein: 7.9 g/dL (ref 6.0–8.5)
eGFR: 113 mL/min/{1.73_m2} (ref >59)

## 2023-03-10 LAB — LIPID PANEL
Chol/HDL Ratio: 2.7 ratio (ref 0.0–4.4)
Cholesterol, Total: 187 mg/dL (ref 100–199)
HDL: 70 mg/dL (ref >39)
LDL Chol Calc (NIH): 104 mg/dL — ABNORMAL HIGH (ref 0–99)
Triglycerides: 71 mg/dL (ref 0–149)
VLDL Cholesterol Cal: 13 mg/dL (ref 5–40)

## 2023-03-10 LAB — CBC
Hematocrit: 45.7 % (ref 34.0–46.6)
Hemoglobin: 15.3 g/dL (ref 11.1–15.9)
MCH: 32.1 pg (ref 26.6–33.0)
MCHC: 33.5 g/dL (ref 31.5–35.7)
MCV: 96 fL (ref 79–97)
Platelets: 243 10*3/uL (ref 150–450)
RBC: 4.76 x10E6/uL (ref 3.77–5.28)
RDW: 11.6 % — ABNORMAL LOW (ref 11.7–15.4)
WBC: 7.3 10*3/uL (ref 3.4–10.8)

## 2023-03-10 LAB — TSH: TSH: 1.63 u[IU]/mL (ref 0.450–4.500)

## 2023-03-10 LAB — HEPATITIS C ANTIBODY: Hep C Virus Ab: NONREACTIVE

## 2023-03-10 LAB — VITAMIN D 25 HYDROXY (VIT D DEFICIENCY, FRACTURES): Vit D, 25-Hydroxy: 30.3 ng/mL (ref 30.0–100.0)

## 2023-07-19 ENCOUNTER — Other Ambulatory Visit: Payer: Self-pay | Admitting: Obstetrics and Gynecology

## 2023-07-19 ENCOUNTER — Encounter: Payer: Self-pay | Admitting: Obstetrics and Gynecology

## 2023-07-19 ENCOUNTER — Ambulatory Visit: Payer: Self-pay | Admitting: Obstetrics and Gynecology

## 2023-07-19 VITALS — BP 146/85 | HR 80 | Ht 64.0 in | Wt 135.5 lb

## 2023-07-19 DIAGNOSIS — Z23 Encounter for immunization: Secondary | ICD-10-CM | POA: Diagnosis not present

## 2023-07-19 DIAGNOSIS — Z1231 Encounter for screening mammogram for malignant neoplasm of breast: Secondary | ICD-10-CM

## 2023-07-19 DIAGNOSIS — Z01419 Encounter for gynecological examination (general) (routine) without abnormal findings: Secondary | ICD-10-CM | POA: Diagnosis not present

## 2023-07-19 DIAGNOSIS — R9230 Dense breasts, unspecified: Secondary | ICD-10-CM

## 2023-07-19 NOTE — Progress Notes (Signed)
Patients presents for annual exam today. She states doing well with current IUD. She is up to date on pap smear.  Request for mammogram due to history of dense breast tissue, ordered. Annual labs are deferred to PCP.  She states no other questions or concerns at this time.

## 2023-07-19 NOTE — Progress Notes (Signed)
HPI:      [name] is a 37 y.o. [id number] who LMP was No LMP recorded. (Menstrual status: IUD).  Subjective:   She presents today for her annual examination.  She has an IUD that she likes.  Has rare light bleeding. She does have a history of an area on her right breast which is "dense".  She would like to get an early mammogram bilaterally. She has been noted to have an elevated blood pressure at her PCPs office.  She has been doing blood pressures at home and reports that her blood pressures at home are normal.   Hx: The following portions of the patient's history were reviewed and updated as appropriate:             She  has a past medical history of Gestational diabetes (2018) and Skin cancer screening (01/19/2023). She does not have any pertinent problems on file. She  has a past surgical history that includes No past surgeries. Her family history includes Anxiety disorder in her father; Breast cancer (age of onset: 24 - 66) in her maternal grandmother; Diabetes (age of onset: 90 - 66) in her paternal aunt; Diabetes (age of onset: 76 - 85) in her paternal grandfather; Healthy in her maternal grandfather, maternal grandmother, mother, and sister; Hyperlipidemia in her father; Hypertension in her father. She  reports that she has never smoked. She has never used smokeless tobacco. She reports current alcohol use of about 2.0 standard drinks of alcohol per week. She reports that she does not use drugs. She has a current medication list which includes the following prescription(s): levonorgestrel and multiple vitamins-minerals. She is allergic to motrin [ibuprofen].       Review of Systems:  Review of Systems  Constitutional: Denied constitutional symptoms, night sweats, recent illness, fatigue, fever, insomnia and weight loss.  Eyes: Denied eye symptoms, eye pain, photophobia, vision change and visual disturbance.  Ears/Nose/Throat/Neck: Denied ear, nose, throat or neck symptoms,  hearing loss, nasal discharge, sinus congestion and sore throat.  Cardiovascular: Denied cardiovascular symptoms, arrhythmia, chest pain/pressure, edema, exercise intolerance, orthopnea and palpitations.  Respiratory: Denied pulmonary symptoms, asthma, pleuritic pain, productive sputum, cough, dyspnea and wheezing.  Gastrointestinal: Denied, gastro-esophageal reflux, melena, nausea and vomiting.  Genitourinary: Denied genitourinary symptoms including symptomatic vaginal discharge, pelvic relaxation issues, and urinary complaints.  Musculoskeletal: Denied musculoskeletal symptoms, stiffness, swelling, muscle weakness and myalgia.  Dermatologic: Denied dermatology symptoms, rash and scar.  Neurologic: Denied neurology symptoms, dizziness, headache, neck pain and syncope.  Psychiatric: Denied psychiatric symptoms, anxiety and depression.  Endocrine: Denied endocrine symptoms including hot flashes and night sweats.   Meds:   Current Outpatient Medications on File Prior to Visit  Medication Sig Dispense Refill   levonorgestrel (MIRENA) 20 MCG/DAY IUD 1 each by Intrauterine route once.     Multiple Vitamins-Minerals (MULTIVITAL PO) Take by mouth.     No current facility-administered medications on file prior to visit.     Objective:     Vitals:   07/19/23 0929 07/19/23 0947  BP: (!) 130/93 (!) 146/85  Pulse: 80     Filed Weights   07/19/23 0929  Weight: 135 lb 8 oz (61.5 kg)              Physical examination General NAD, Conversant  HEENT Atraumatic; Op clear with mmm.  Normo-cephalic.  Anicteric sclerae  Thyroid/Neck Smooth without nodularity or enlargement. Normal ROM.  Neck Supple.  Skin No rashes, lesions or ulceration. Normal palpated skin turgor.  No nodularity.  Breasts: No masses or discharge.  Symmetric.  No axillary adenopathy.  Lungs: Clear to auscultation.No rales or wheezes. Normal Respiratory effort, no retractions.  Heart: NSR.  No murmurs or rubs appreciated. No  peripheral edema  Abdomen: Soft.  Non-tender.  No masses.  No HSM. No hernia  Extremities: Moves all appropriately.  Normal ROM for age. No lymphadenopathy.  Neuro: Oriented to PPT.  Normal mood. Normal affect.     Pelvic: Deferred     Assessment:    [id number] Patient Active Problem List   Diagnosis Date Noted   Healthcare maintenance 03/09/2023   Scalp cyst 01/19/2023   Skin cancer screening 01/19/2023   Encounter to establish care 01/19/2023   Normal labor 04/23/2019   Group B streptococcal carriage complicating pregnancy 04/10/2019   Gestational thrombocytopenia, third trimester ([facility]) 03/12/2019   Supervision of normal pregnancy 03/12/2019   History of gestational diabetes in prior pregnancy, currently pregnant 03/12/2019     1. Well woman exam with routine gynecological exam   2. Screening mammogram for breast cancer   3. Dense breast tissue     Some elevated blood pressures today in the office.  I have discussed this with her and she plans to follow-up with her PCP and discuss elevated blood pressures at doctors office versus at home where they are normal.   Plan:            1.  Basic Screening Recommendations The basic screening recommendations for asymptomatic women were discussed with the patient during her visit.  The age-appropriate recommendations were discussed with her and the rational for the tests reviewed.  When I am informed by the patient that another primary care physician has previously obtained the age-appropriate tests and they are up-to-date, only outstanding tests are ordered and referrals given as necessary.  Abnormal results of tests will be discussed with her when all of her results are completed.  Routine preventative health maintenance measures emphasized: Exercise/Diet/Weight control, Tobacco Warnings, Alcohol/Substance use risks and Stress Management Mammogram ordered  2.  She will follow-up with her PCP regarding elevations in blood pressure as her  PCP has been aware of this.  Orders Orders Placed This Encounter  Procedures   MM Digital Diagnostic Bilat    No orders of the defined types were placed in this encounter.         F/U  Return in about 1 year (around 07/18/2024) for Annual Physical.  Elonda Husky, M.D. 07/19/2023 10:03 AM

## 2023-08-01 ENCOUNTER — Encounter: Payer: Self-pay | Admitting: Obstetrics and Gynecology

## 2023-08-02 ENCOUNTER — Ambulatory Visit: Payer: Managed Care, Other (non HMO)

## 2023-08-02 ENCOUNTER — Other Ambulatory Visit: Payer: Self-pay

## 2023-08-02 DIAGNOSIS — Z01419 Encounter for gynecological examination (general) (routine) without abnormal findings: Secondary | ICD-10-CM

## 2023-08-02 DIAGNOSIS — Z1231 Encounter for screening mammogram for malignant neoplasm of breast: Secondary | ICD-10-CM

## 2023-08-14 LAB — HM MAMMOGRAPHY

## 2023-08-16 ENCOUNTER — Other Ambulatory Visit: Payer: Self-pay | Admitting: Obstetrics and Gynecology

## 2024-01-11 ENCOUNTER — Ambulatory Visit: Payer: 59 | Admitting: Dermatology

## 2024-01-22 DIAGNOSIS — D239 Other benign neoplasm of skin, unspecified: Secondary | ICD-10-CM

## 2024-01-22 HISTORY — DX: Other benign neoplasm of skin, unspecified: D23.9

## 2024-01-23 ENCOUNTER — Ambulatory Visit: Admitting: Dermatology

## 2024-01-23 ENCOUNTER — Encounter: Payer: Self-pay | Admitting: Dermatology

## 2024-01-23 DIAGNOSIS — L820 Inflamed seborrheic keratosis: Secondary | ICD-10-CM | POA: Diagnosis not present

## 2024-01-23 DIAGNOSIS — W908XXA Exposure to other nonionizing radiation, initial encounter: Secondary | ICD-10-CM | POA: Diagnosis not present

## 2024-01-23 DIAGNOSIS — D2222 Melanocytic nevi of left ear and external auricular canal: Secondary | ICD-10-CM

## 2024-01-23 DIAGNOSIS — D492 Neoplasm of unspecified behavior of bone, soft tissue, and skin: Secondary | ICD-10-CM | POA: Diagnosis not present

## 2024-01-23 DIAGNOSIS — D489 Neoplasm of uncertain behavior, unspecified: Secondary | ICD-10-CM

## 2024-01-23 DIAGNOSIS — L578 Other skin changes due to chronic exposure to nonionizing radiation: Secondary | ICD-10-CM | POA: Diagnosis not present

## 2024-01-23 DIAGNOSIS — D229 Melanocytic nevi, unspecified: Secondary | ICD-10-CM

## 2024-01-23 DIAGNOSIS — D225 Melanocytic nevi of trunk: Secondary | ICD-10-CM

## 2024-01-23 DIAGNOSIS — L821 Other seborrheic keratosis: Secondary | ICD-10-CM

## 2024-01-23 DIAGNOSIS — Z1283 Encounter for screening for malignant neoplasm of skin: Secondary | ICD-10-CM | POA: Diagnosis not present

## 2024-01-23 DIAGNOSIS — Z7189 Other specified counseling: Secondary | ICD-10-CM

## 2024-01-23 DIAGNOSIS — L814 Other melanin hyperpigmentation: Secondary | ICD-10-CM

## 2024-01-23 DIAGNOSIS — D2261 Melanocytic nevi of right upper limb, including shoulder: Secondary | ICD-10-CM

## 2024-01-23 DIAGNOSIS — D2262 Melanocytic nevi of left upper limb, including shoulder: Secondary | ICD-10-CM

## 2024-01-23 NOTE — Patient Instructions (Addendum)

## 2024-01-23 NOTE — Progress Notes (Signed)
 New Patient Visit   Subjective  Danielle Chen is a 38 y.o. female who presents for the following: Skin Cancer Screening and Full Body Skin Exam No family or personal history of skin cancer.  Patient reports top of forehead and top of ears.   The patient presents for Total-Body Skin Exam (TBSE) for skin cancer screening and mole check. The patient has spots, moles and lesions to be evaluated, some may be new or changing and the patient may have concern these could be cancer.  The following portions of the chart were reviewed this encounter and updated as appropriate: medications, allergies, medical history  Review of Systems:  No other skin or systemic complaints except as noted in HPI or Assessment and Plan.                                               Objective  Well appearing patient in no apparent distress; mood and affect are within normal limits.  A full examination was performed including scalp, head, eyes, ears, nose, lips, neck, chest, axillae, abdomen, back, buttocks, bilateral upper extremities, bilateral lower extremities, hands, feet, fingers, toes, fingernails, and toenails. All findings within normal limits unless otherwise noted below.   Relevant physical exam findings are noted in the Assessment and Plan.  right superior forehead superior lateral to nevus of right forehead 0.3 cm flesh colored papule  right lateral abdomen 0.6 cm irregular brown macule   left lateral abdomen 0.6 cm irregular brown macule    Assessment & Plan   SKIN CANCER SCREENING PERFORMED TODAY.  ACTINIC DAMAGE - Chronic condition, secondary to cumulative UV/sun exposure - diffuse scaly erythematous macules with underlying dyspigmentation - Recommend daily broad spectrum sunscreen SPF 30+ to sun-exposed areas, reapply every 2 hours as needed.  - Staying in the shade or wearing long sleeves, sun glasses (UVA+UVB protection) and wide brim  hats (4-inch brim around the entire circumference of the hat) are also recommended for sun protection.  - Call for new or changing lesions.  LENTIGINES, SEBORRHEIC KERATOSES, HEMANGIOMAS - Benign normal skin lesions - Benign-appearing - Call for any changes  MELANOCYTIC NEVI -  0.5 x 0.3 cm light brown regular macule at left superior helix see photo - multiple brown macules at chest, b/l arms, back, abdomen see photos above  - 0.3 cm irregular brown macule at Right superior breast see photos - will recheck at next followup - discussed may need biopsy if other biopsies taken today come back dysplastic. -  0.2 cm dark brown macule at Right Posterior Waistline lateral portion of sunflower tattoo - see photo - discussed may need biopsy if other biopsies taken today come back dysplastic  - 0.3 cm dark brown macule at Right superior buttock lateral to the tattoo sunflower leaf - see photo - discussed may need biopsy if other biopsies taken today come back dysplastic  Will recheck all locations at next follow up - Tan-brown and/or pink-flesh-colored symmetric macules and papules - Benign appearing on exam today - Observation - Call clinic for new or changing moles - Recommend daily use of broad spectrum spf 30+ sunscreen to sun-exposed areas.   Benign-appearing.  Observation.  Call clinic for new or changing lesions.  Recommend daily use of broad spectrum spf 30+ sunscreen to sun-exposed areas.   INFLAMED SEBORRHEIC KERATOSIS right superior forehead superior lateral to nevus  of right forehead   Symptomatic, irritating, patient would like treated. Destruction of lesion - right superior forehead superior lateral to nevus of right forehead Complexity: simple   Destruction method: cryotherapy   Informed consent: discussed and consent obtained   Timeout:  patient name, date of birth, surgical site, and procedure verified Lesion destroyed using liquid nitrogen: Yes   Region frozen until ice ball  extended beyond lesion: Yes   Outcome: patient tolerated procedure well with no complications   Post-procedure details: wound care instructions given   NEOPLASM OF UNCERTAIN BEHAVIOR (2) right lateral abdomen Epidermal / dermal shaving  Lesion diameter (cm):  0.6 Informed consent: discussed and consent obtained   Timeout: patient name, date of birth, surgical site, and procedure verified   Procedure prep:  Patient was prepped and draped in usual sterile fashion Prep type:  Isopropyl alcohol Anesthesia: the lesion was anesthetized in a standard fashion   Anesthetic:  1% lidocaine w/ epinephrine 1-100,000 buffered w/ 8.4% NaHCO3 Instrument used: flexible razor blade   Hemostasis achieved with: pressure, aluminum chloride and electrodesiccation   Outcome: patient tolerated procedure well   Post-procedure details: sterile dressing applied and wound care instructions given   Dressing type: bandage and petrolatum   Specimen 1 - Surgical pathology Differential Diagnosis: nevus r/o dysplasia   Check Margins: yes left lateral abdomen Epidermal / dermal shaving  Lesion diameter (cm):  0.6 Informed consent: discussed and consent obtained   Timeout: patient name, date of birth, surgical site, and procedure verified   Procedure prep:  Patient was prepped and draped in usual sterile fashion Prep type:  Isopropyl alcohol Anesthesia: the lesion was anesthetized in a standard fashion   Anesthetic:  1% lidocaine w/ epinephrine 1-100,000 buffered w/ 8.4% NaHCO3 Instrument used: flexible razor blade   Hemostasis achieved with: pressure, aluminum chloride and electrodesiccation   Outcome: patient tolerated procedure well   Post-procedure details: sterile dressing applied and wound care instructions given   Dressing type: bandage and petrolatum   Specimen 2 - Surgical pathology Differential Diagnosis: nevus r/o dysplasia   Check Margins: yes Nevus r/o dysplasia   Return for 6 month tbse .  IRandee Busing, CMA, am acting as scribe for Celine Collard, MD.   Documentation: I have reviewed the above documentation for accuracy and completeness, and I agree with the above.  Celine Collard, MD

## 2024-01-31 LAB — SURGICAL PATHOLOGY

## 2024-02-01 ENCOUNTER — Telehealth: Payer: Self-pay

## 2024-02-01 ENCOUNTER — Encounter: Payer: Self-pay | Admitting: Dermatology

## 2024-02-01 NOTE — Telephone Encounter (Signed)
-----   Message from Celine Collard sent at 02/01/2024 11:19 AM EDT ----- FINAL DIAGNOSIS        1. Skin, right lateral abdomen :       DYSPLASTIC COMPOUND NEVUS WITH MODERATE ATYPIA, DEEP MARGIN INVOLVED        2. Skin, left lateral abdomen :       JUNCTIONAL DYSPLASTIC MELANOCYTIC NEVUS WITH MODERATE TO SEVERE ATYPIA, CLOSE TO       MARGIN, SEE DESCRIPTIO   1- Moderate dysplastic Recheck next visit 2- Moderate to Severe dysplastic May need additional procedure Recheck next visit  There are other moles we may want to remove because these were abnormal.  Pt may keep Oct 2025 visit for all this, but can come earlier if any openings.

## 2024-02-01 NOTE — Telephone Encounter (Signed)
 Discussed biopsy results with patient

## 2024-07-30 ENCOUNTER — Encounter: Payer: Self-pay | Admitting: Dermatology

## 2024-07-30 ENCOUNTER — Ambulatory Visit: Admitting: Dermatology

## 2024-07-30 DIAGNOSIS — Z86018 Personal history of other benign neoplasm: Secondary | ICD-10-CM

## 2024-07-30 DIAGNOSIS — Z7189 Other specified counseling: Secondary | ICD-10-CM

## 2024-07-30 DIAGNOSIS — L578 Other skin changes due to chronic exposure to nonionizing radiation: Secondary | ICD-10-CM

## 2024-07-30 DIAGNOSIS — L814 Other melanin hyperpigmentation: Secondary | ICD-10-CM

## 2024-07-30 DIAGNOSIS — D225 Melanocytic nevi of trunk: Secondary | ICD-10-CM

## 2024-07-30 DIAGNOSIS — Z1283 Encounter for screening for malignant neoplasm of skin: Secondary | ICD-10-CM

## 2024-07-30 DIAGNOSIS — D229 Melanocytic nevi, unspecified: Secondary | ICD-10-CM

## 2024-07-30 DIAGNOSIS — D492 Neoplasm of unspecified behavior of bone, soft tissue, and skin: Secondary | ICD-10-CM | POA: Diagnosis not present

## 2024-07-30 DIAGNOSIS — W908XXA Exposure to other nonionizing radiation, initial encounter: Secondary | ICD-10-CM

## 2024-07-30 DIAGNOSIS — L821 Other seborrheic keratosis: Secondary | ICD-10-CM

## 2024-07-30 NOTE — Progress Notes (Signed)
 Follow-Up Visit   Subjective  Danielle Chen is a 38 y.o. female who presents for the following: Skin Cancer Screening and Full Body Skin Exam, hx of Dysplastic nevus.  The patient presents for Total-Body Skin Exam (TBSE) for skin cancer screening and mole check. The patient has spots, moles and lesions to be evaluated, some may be new or changing and the patient may have concern these could be cancer.  The following portions of the chart were reviewed this encounter and updated as appropriate: medications, allergies, medical history  Review of Systems:  No other skin or systemic complaints except as noted in HPI or Assessment and Plan.  Objective  Well appearing patient in no apparent distress; mood and affect are within normal limits.  A full examination was performed including scalp, head, eyes, ears, nose, lips, neck, chest, axillae, abdomen, back, buttocks, bilateral upper extremities, bilateral lower extremities, hands, feet, fingers, toes, fingernails, and toenails. All findings within normal limits unless otherwise noted below.   Relevant physical exam findings are noted in the Assessment and Plan.  right mid back lateral braline medial to tattoo 0.6 cm irregular brown macule   right superior buttock posterior  waistline 0.6 cm irregular brown macule   Assessment & Plan   SKIN CANCER SCREENING PERFORMED TODAY.  ACTINIC DAMAGE - Chronic condition, secondary to cumulative UV/sun exposure - diffuse scaly erythematous macules with underlying dyspigmentation - Recommend daily broad spectrum sunscreen SPF 30+ to sun-exposed areas, reapply every 2 hours as needed.  - Staying in the shade or wearing long sleeves, sun glasses (UVA+UVB protection) and wide brim hats (4-inch brim around the entire circumference of the hat) are also recommended for sun protection.  - Call for new or changing lesions.  LENTIGINES, SEBORRHEIC KERATOSES, HEMANGIOMAS - Benign normal skin  lesions - Benign-appearing - Call for any changes  MELANOCYTIC NEVI - Tan-brown and/or pink-flesh-colored symmetric macules and papules - Benign appearing on exam today - Observation - Call clinic for new or changing moles - Recommend daily use of broad spectrum spf 30+ sunscreen to sun-exposed areas.   HISTORY OF DYSPLASTIC NEVUS No evidence of recurrence today Recommend regular full body skin exams Recommend daily broad spectrum sunscreen SPF 30+ to sun-exposed areas, reapply every 2 hours as needed.  Call if any new or changing lesions are noted between office visits    NEOPLASM OF SKIN (2) right mid back lateral braline medial to tattoo Epidermal / dermal shaving  Lesion diameter (cm):  0.6 Informed consent: discussed and consent obtained   Timeout: patient name, date of birth, surgical site, and procedure verified   Procedure prep:  Patient was prepped and draped in usual sterile fashion Prep type:  Isopropyl alcohol Anesthesia: the lesion was anesthetized in a standard fashion   Anesthetic:  1% lidocaine  w/ epinephrine 1-100,000 buffered w/ 8.4% NaHCO3 Hemostasis achieved with: pressure, aluminum chloride and electrodesiccation   Outcome: patient tolerated procedure well   Post-procedure details: sterile dressing applied and wound care instructions given   Dressing type: bandage and petrolatum    Specimen 1 - Surgical pathology Differential Diagnosis: R/O Dysplastic nevus   Check Margins: No right superior buttock posterior  waistline Epidermal / dermal shaving  Lesion diameter (cm):  0.6 Informed consent: discussed and consent obtained   Timeout: patient name, date of birth, surgical site, and procedure verified   Procedure prep:  Patient was prepped and draped in usual sterile fashion Prep type:  Isopropyl alcohol Anesthesia: the lesion was anesthetized in  a standard fashion   Anesthetic:  1% lidocaine  w/ epinephrine 1-100,000 buffered w/ 8.4%  NaHCO3 Hemostasis achieved with: pressure, aluminum chloride and electrodesiccation   Outcome: patient tolerated procedure well   Post-procedure details: sterile dressing applied and wound care instructions given   Dressing type: bandage and petrolatum    Specimen 2 - Surgical pathology Differential Diagnosis: R/O Dysplastic nevus   Check Margins: No   Return in about 6 months (around 01/28/2025) for TBSE, hx of Dysplastic nevus .  IFay Kirks, CMA, am acting as scribe for Alm Rhyme, MD .   Documentation: I have reviewed the above documentation for accuracy and completeness, and I agree with the above.  Alm Rhyme, MD

## 2024-07-30 NOTE — Patient Instructions (Addendum)

## 2024-08-01 LAB — SURGICAL PATHOLOGY

## 2024-08-02 ENCOUNTER — Ambulatory Visit: Payer: Self-pay | Admitting: Dermatology

## 2024-08-05 ENCOUNTER — Encounter: Payer: Self-pay | Admitting: Dermatology

## 2024-08-05 NOTE — Telephone Encounter (Addendum)
 Called and discussed bx results with patient. She verbalized understanding and denied further questions. Will recheck areas at next followup. ----- Message from Alm Rhyme sent at 08/02/2024  4:29 PM EDT ----- FINAL DIAGNOSIS        1. Skin, right mid back lateral braline medial to tattoo :       DYSPLASTIC COMPOUND NEVUS WITH MODERATE ATYPIA, CLOSE TO MARGIN        2. Skin, right superior buttock posterior waistline :       JUNCTIONAL DYSPLASTIC MELANOCYTIC NEVUS WITH MODERATE TO SEVERE ATYPIA, CLOSE TO       MARGIN, SEE DESCRIPTION   1- Moderate Dysplastic Recheck next visit 2- Moderate to Severe Dysplastic Margins clear, but CLOSE TO MARGIN May need additional procedure Recheck next visit April 2026  ----- Message ----- From: Interface, [facility] Sent: 08/01/2024   4:21 PM EDT To: Alm JAYSON Rhyme, MD

## 2024-08-14 LAB — HM MAMMOGRAPHY

## 2024-08-19 ENCOUNTER — Encounter: Payer: Self-pay | Admitting: Obstetrics and Gynecology

## 2024-11-05 ENCOUNTER — Telehealth: Payer: Self-pay

## 2024-11-05 NOTE — Telephone Encounter (Signed)
 Copied from CRM [id number]. Topic: Appointments - Transfer of Care >> Nov 05, 2024 10:52 AM Montie POUR wrote: Pt is requesting to transfer FROM: Dr. Selinda Ku Pt is requesting to transfer TO: Dr. Harlene Saddler Reason for requested transfer: Ms. Debruyn received message that Dr. Ku is leaving in May 2026 and she wanted to go ahead and change doctors.  It is the responsibility of the team the patient would like to transfer to (Dr. Saddler) to reach out to the patient if for any reason this transfer is not acceptable.

## 2024-11-06 ENCOUNTER — Encounter: Admitting: Family Medicine

## 2024-12-04 ENCOUNTER — Encounter: Admitting: Student

## 2024-12-04 ENCOUNTER — Encounter: Admitting: Physician Assistant

## 2025-01-29 ENCOUNTER — Ambulatory Visit: Admitting: Dermatology
